# Patient Record
Sex: Male | Born: 1975 | Race: Black or African American | Hispanic: No | Marital: Single | Smoking: Current every day smoker
Health system: Southern US, Community
[De-identification: ages and names within clinical notes are randomized; demographics above are authoritative.]

## PROBLEM LIST (undated history)

## (undated) DIAGNOSIS — B20 Human immunodeficiency virus [HIV] disease: Secondary | ICD-10-CM

## (undated) DIAGNOSIS — F4310 Post-traumatic stress disorder, unspecified: Secondary | ICD-10-CM

## (undated) DIAGNOSIS — Z21 Asymptomatic human immunodeficiency virus [HIV] infection status: Secondary | ICD-10-CM

## (undated) DIAGNOSIS — K611 Rectal abscess: Secondary | ICD-10-CM

## (undated) DIAGNOSIS — B59 Pneumocystosis: Secondary | ICD-10-CM

## (undated) DIAGNOSIS — Z96643 Presence of artificial hip joint, bilateral: Secondary | ICD-10-CM

## (undated) HISTORY — DX: Human immunodeficiency virus (HIV) disease: B20

## (undated) HISTORY — DX: Post-traumatic stress disorder, unspecified: F43.10

## (undated) HISTORY — DX: Presence of artificial hip joint, bilateral: Z96.643

## (undated) HISTORY — DX: Rectal abscess: K61.1

## (undated) HISTORY — PX: HERNIA REPAIR: SHX51

## (undated) HISTORY — PX: JOINT REPLACEMENT: SHX530

## (undated) HISTORY — DX: Pneumocystosis: B59

## (undated) HISTORY — DX: Asymptomatic human immunodeficiency virus (hiv) infection status: Z21

---

## 2003-06-17 DIAGNOSIS — B59 Pneumocystosis: Secondary | ICD-10-CM

## 2003-06-17 HISTORY — DX: Pneumocystosis: B59

## 2014-02-14 DIAGNOSIS — M549 Dorsalgia, unspecified: Secondary | ICD-10-CM | POA: Diagnosis not present

## 2014-02-14 DIAGNOSIS — Z7189 Other specified counseling: Secondary | ICD-10-CM | POA: Diagnosis not present

## 2014-02-14 DIAGNOSIS — M545 Low back pain, unspecified: Secondary | ICD-10-CM | POA: Diagnosis not present

## 2014-02-14 DIAGNOSIS — R5381 Other malaise: Secondary | ICD-10-CM | POA: Diagnosis not present

## 2014-02-14 DIAGNOSIS — H539 Unspecified visual disturbance: Secondary | ICD-10-CM | POA: Diagnosis not present

## 2014-02-14 DIAGNOSIS — I69998 Other sequelae following unspecified cerebrovascular disease: Secondary | ICD-10-CM | POA: Diagnosis not present

## 2014-02-14 DIAGNOSIS — B20 Human immunodeficiency virus [HIV] disease: Secondary | ICD-10-CM | POA: Diagnosis not present

## 2014-02-14 DIAGNOSIS — M25559 Pain in unspecified hip: Secondary | ICD-10-CM | POA: Diagnosis not present

## 2014-02-22 DIAGNOSIS — M899 Disorder of bone, unspecified: Secondary | ICD-10-CM | POA: Diagnosis not present

## 2014-02-22 DIAGNOSIS — M2550 Pain in unspecified joint: Secondary | ICD-10-CM | POA: Diagnosis not present

## 2014-02-22 DIAGNOSIS — M25559 Pain in unspecified hip: Secondary | ICD-10-CM | POA: Diagnosis not present

## 2014-02-23 DIAGNOSIS — R05 Cough: Secondary | ICD-10-CM | POA: Diagnosis not present

## 2014-02-23 DIAGNOSIS — J309 Allergic rhinitis, unspecified: Secondary | ICD-10-CM | POA: Diagnosis not present

## 2014-02-23 DIAGNOSIS — R059 Cough, unspecified: Secondary | ICD-10-CM | POA: Diagnosis not present

## 2014-02-23 DIAGNOSIS — J158 Pneumonia due to other specified bacteria: Secondary | ICD-10-CM | POA: Diagnosis not present

## 2014-02-23 DIAGNOSIS — B20 Human immunodeficiency virus [HIV] disease: Secondary | ICD-10-CM | POA: Diagnosis not present

## 2014-03-07 DIAGNOSIS — M25559 Pain in unspecified hip: Secondary | ICD-10-CM | POA: Diagnosis not present

## 2014-03-07 DIAGNOSIS — R05 Cough: Secondary | ICD-10-CM | POA: Diagnosis not present

## 2014-03-07 DIAGNOSIS — B20 Human immunodeficiency virus [HIV] disease: Secondary | ICD-10-CM | POA: Diagnosis not present

## 2014-03-07 DIAGNOSIS — R059 Cough, unspecified: Secondary | ICD-10-CM | POA: Diagnosis not present

## 2014-03-07 DIAGNOSIS — M549 Dorsalgia, unspecified: Secondary | ICD-10-CM | POA: Diagnosis not present

## 2014-04-06 DIAGNOSIS — J329 Chronic sinusitis, unspecified: Secondary | ICD-10-CM | POA: Diagnosis not present

## 2014-04-06 DIAGNOSIS — Z7189 Other specified counseling: Secondary | ICD-10-CM | POA: Diagnosis not present

## 2014-04-06 DIAGNOSIS — M25559 Pain in unspecified hip: Secondary | ICD-10-CM | POA: Diagnosis not present

## 2014-06-22 DIAGNOSIS — M87859 Other osteonecrosis, unspecified femur: Secondary | ICD-10-CM | POA: Diagnosis not present

## 2014-06-22 DIAGNOSIS — M545 Low back pain: Secondary | ICD-10-CM | POA: Diagnosis not present

## 2014-06-22 DIAGNOSIS — Z23 Encounter for immunization: Secondary | ICD-10-CM | POA: Diagnosis not present

## 2014-06-22 DIAGNOSIS — E559 Vitamin D deficiency, unspecified: Secondary | ICD-10-CM | POA: Diagnosis not present

## 2014-06-22 DIAGNOSIS — B20 Human immunodeficiency virus [HIV] disease: Secondary | ICD-10-CM | POA: Diagnosis not present

## 2014-06-22 DIAGNOSIS — M1990 Unspecified osteoarthritis, unspecified site: Secondary | ICD-10-CM | POA: Diagnosis not present

## 2014-07-06 DIAGNOSIS — B20 Human immunodeficiency virus [HIV] disease: Secondary | ICD-10-CM | POA: Diagnosis not present

## 2014-07-06 DIAGNOSIS — A530 Latent syphilis, unspecified as early or late: Secondary | ICD-10-CM | POA: Diagnosis not present

## 2014-07-06 DIAGNOSIS — M87859 Other osteonecrosis, unspecified femur: Secondary | ICD-10-CM | POA: Diagnosis not present

## 2014-07-06 DIAGNOSIS — E559 Vitamin D deficiency, unspecified: Secondary | ICD-10-CM | POA: Diagnosis not present

## 2014-08-17 DIAGNOSIS — A530 Latent syphilis, unspecified as early or late: Secondary | ICD-10-CM | POA: Diagnosis not present

## 2014-08-17 DIAGNOSIS — M87859 Other osteonecrosis, unspecified femur: Secondary | ICD-10-CM | POA: Diagnosis not present

## 2014-08-17 DIAGNOSIS — B20 Human immunodeficiency virus [HIV] disease: Secondary | ICD-10-CM | POA: Diagnosis not present

## 2014-08-17 DIAGNOSIS — E559 Vitamin D deficiency, unspecified: Secondary | ICD-10-CM | POA: Diagnosis not present

## 2014-10-19 DIAGNOSIS — Z029 Encounter for administrative examinations, unspecified: Secondary | ICD-10-CM | POA: Diagnosis not present

## 2014-10-19 DIAGNOSIS — L989 Disorder of the skin and subcutaneous tissue, unspecified: Secondary | ICD-10-CM | POA: Diagnosis not present

## 2015-01-25 ENCOUNTER — Other Ambulatory Visit (HOSPITAL_COMMUNITY)
Admission: RE | Admit: 2015-01-25 | Discharge: 2015-01-25 | Disposition: A | Discharge status: Home / Self Care | Payer: Medicare Other | Source: Ambulatory Visit | Attending: Internal Medicine | Admitting: Internal Medicine

## 2015-01-25 ENCOUNTER — Other Ambulatory Visit: Payer: Medicare Other

## 2015-01-25 ENCOUNTER — Ambulatory Visit: Payer: Medicare Other

## 2015-01-25 DIAGNOSIS — B20 Human immunodeficiency virus [HIV] disease: Secondary | ICD-10-CM

## 2015-01-25 DIAGNOSIS — Z113 Encounter for screening for infections with a predominantly sexual mode of transmission: Secondary | ICD-10-CM | POA: Insufficient documentation

## 2015-01-25 DIAGNOSIS — Z79899 Other long term (current) drug therapy: Secondary | ICD-10-CM | POA: Diagnosis not present

## 2015-01-25 LAB — COMPLETE METABOLIC PANEL WITH GFR
ALT: 16 U/L (ref 9–46)
AST: 22 U/L (ref 10–40)
Albumin: 4.2 g/dL (ref 3.6–5.1)
Alkaline Phosphatase: 41 U/L (ref 40–115)
BUN: 11 mg/dL (ref 7–25)
CALCIUM: 9.7 mg/dL (ref 8.6–10.3)
CO2: 28 mmol/L (ref 20–31)
Chloride: 102 mmol/L (ref 98–110)
Creat: 0.94 mg/dL (ref 0.60–1.35)
GFR, Est Non African American: >89 mL/min (ref >=60)
GLUCOSE: 77 mg/dL (ref 65–99)
POTASSIUM: 3.8 mmol/L (ref 3.5–5.3)
SODIUM: 139 mmol/L (ref 135–146)
TOTAL PROTEIN: 7.2 g/dL (ref 6.1–8.1)
Total Bilirubin: 0.4 mg/dL (ref 0.2–1.2)

## 2015-01-25 LAB — CBC WITH DIFFERENTIAL/PLATELET
BASOS ABS: 0 10*3/uL (ref 0.0–0.1)
Basophils Relative: 1 % (ref 0–1)
Eosinophils Absolute: 0.4 10*3/uL (ref 0.0–0.7)
Eosinophils Relative: 11 % — ABNORMAL HIGH (ref 0–5)
HCT: 39.7 % (ref 39.0–52.0)
Hemoglobin: 13.9 g/dL (ref 13.0–17.0)
Lymphocytes Relative: 46 % (ref 12–46)
Lymphs Abs: 1.8 10*3/uL (ref 0.7–4.0)
MCH: 31.2 pg (ref 26.0–34.0)
MCHC: 35 g/dL (ref 30.0–36.0)
MCV: 89.2 fL (ref 78.0–100.0)
MPV: 10.3 fL (ref 8.6–12.4)
Monocytes Absolute: 0.6 10*3/uL (ref 0.1–1.0)
Monocytes Relative: 16 % — ABNORMAL HIGH (ref 3–12)
Neutro Abs: 1 10*3/uL — ABNORMAL LOW (ref 1.7–7.7)
Neutrophils Relative %: 26 % — ABNORMAL LOW (ref 43–77)
PLATELETS: 164 10*3/uL (ref 150–400)
RBC: 4.45 MIL/uL (ref 4.22–5.81)
RDW: 14.9 % (ref 11.5–15.5)
WBC: 3.9 10*3/uL — ABNORMAL LOW (ref 4.0–10.5)

## 2015-01-25 LAB — HEPATITIS A ANTIBODY, TOTAL: Hep A Total Ab: REACTIVE — AB

## 2015-01-25 LAB — LIPID PANEL
CHOLESTEROL: 135 mg/dL (ref 125–200)
HDL: 33 mg/dL — ABNORMAL LOW (ref >=40)
LDL Cholesterol: 87 mg/dL (ref <130)
Total CHOL/HDL Ratio: 4.1 Ratio (ref <=5.0)
Triglycerides: 77 mg/dL (ref <150)
VLDL: 15 mg/dL (ref <30)

## 2015-01-25 LAB — HEPATITIS B SURFACE ANTIBODY,QUALITATIVE: Hep B S Ab: POSITIVE — AB

## 2015-01-25 LAB — HEPATITIS B SURFACE ANTIGEN: HEP B S AG: NEGATIVE

## 2015-01-25 LAB — HEPATITIS B CORE ANTIBODY, TOTAL: HEP B C TOTAL AB: NONREACTIVE

## 2015-01-25 LAB — HEPATITIS C ANTIBODY: HCV Ab: NEGATIVE

## 2015-01-25 NOTE — Progress Notes (Signed)
Patient is here today for his first appointment for HIV intake and labwork. Patient transferred from [facility] where he was receiving care. Patient states that he is currently taking Genvoya and has tried not to miss any doses. 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 Overall he has no health complaints, but he was concerned about his housing situation. He has been living in a hotel, but just recently moved into his car. He works at [employer] and will receive his pay on 8/19, at this time he states that he can pay for another week at the hotel. I gave him the number to [facility] for Amber to help, and he needs to contact Baird Lyons his case manager at [facility]. He was ok with this plan. He will return on 02/21/15 to see Dr. Drue Second. I advised that I will call him if his labs come back worrisome.

## 2015-01-26 LAB — URINALYSIS
BILIRUBIN URINE: NEGATIVE
GLUCOSE, UA: NEGATIVE
Hgb urine dipstick: NEGATIVE
KETONES UR: NEGATIVE
Leukocytes, UA: NEGATIVE
Nitrite: NEGATIVE
PROTEIN: NEGATIVE
Specific Gravity, Urine: 1.022 (ref 1.001–1.035)
pH: 7.5 (ref 5.0–8.0)

## 2015-01-26 LAB — RPR TITER: RPR Titer: 1:8 {titer}

## 2015-01-26 LAB — URINE CYTOLOGY ANCILLARY ONLY
CHLAMYDIA, DNA PROBE: NEGATIVE
Neisseria Gonorrhea: NEGATIVE

## 2015-01-26 LAB — T-HELPER CELL (CD4) - (RCID CLINIC ONLY)
CD4 T CELL HELPER: 4 % — AB (ref 33–55)
CD4 T Cell Abs: 80 /uL — ABNORMAL LOW (ref 400–2700)

## 2015-01-26 LAB — FLUORESCENT TREPONEMAL AB(FTA)-IGG-BLD: FLUORESCENT TREPONEMAL ABS: REACTIVE — AB

## 2015-01-26 LAB — RPR: RPR: REACTIVE — AB

## 2015-01-26 NOTE — Addendum Note (Signed)
Addended by: Mariea Clonts D on: 01/26/2015 10:30 AM   Modules accepted: Orders

## 2015-01-28 LAB — QUANTIFERON TB GOLD ASSAY (BLOOD)
Interferon Gamma Release Assay: NEGATIVE
MITOGEN VALUE: 5.56 [IU]/mL
Quantiferon Nil Value: 0.04 IU/mL
Quantiferon Tb Ag Minus Nil Value: 0 IU/mL
TB AG VALUE: 0.04 [IU]/mL

## 2015-01-29 LAB — HIV-1 RNA ULTRAQUANT REFLEX TO GENTYP+
HIV 1 RNA Quant: 20900 copies/mL — ABNORMAL HIGH (ref <20)
HIV-1 RNA Quant, Log: 4.32 {Log} — ABNORMAL HIGH (ref <1.30)

## 2015-02-01 ENCOUNTER — Telehealth: Payer: Self-pay | Admitting: *Deleted

## 2015-02-01 NOTE — Telephone Encounter (Signed)
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 His number is not working at this time and have left a message with his THP counselor if he has contact to give [place] a call asap.

## 2015-02-07 ENCOUNTER — Telehealth: Payer: Self-pay | Admitting: *Deleted

## 2015-02-07 ENCOUNTER — Ambulatory Visit (INDEPENDENT_AMBULATORY_CARE_PROVIDER_SITE_OTHER): Payer: Medicare Other | Admitting: *Deleted

## 2015-02-07 DIAGNOSIS — A539 Syphilis, unspecified: Secondary | ICD-10-CM | POA: Diagnosis present

## 2015-02-07 LAB — HIV-1 GENOTYPR PLUS

## 2015-02-07 MED ORDER — PENICILLIN G BENZATHINE 1200000 UNIT/2ML IM SUSP
1.2000 10*6.[IU] | Freq: Once | INTRAMUSCULAR | Status: AC
  Administered 2015-02-07: 1.2 10*6.[IU] via INTRAMUSCULAR

## 2015-02-07 NOTE — Telephone Encounter (Signed)
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 Patient given 1 dose per Dr Drue Second. He was asked if he had been treated before and he advised yes with 1 dose and has not had sexual contact of any kind since then. Advised him will let the doctor know and that she may need him to be treated with 2 additional doses to complete the treatment. The patient advised he will wait for our call.

## 2015-02-07 NOTE — Telephone Encounter (Signed)
-----   Message from Judyann Munson, MD sent at 01/26/2015  1:38 PM EDT ----- Can we call him to see if he has ever been treated for syphilis? He may need to come to clinic for IM bicillin next week

## 2015-02-12 ENCOUNTER — Telehealth: Payer: Self-pay

## 2015-02-12 ENCOUNTER — Encounter: Payer: Self-pay | Admitting: *Deleted

## 2015-02-12 DIAGNOSIS — B20 Human immunodeficiency virus [HIV] disease: Secondary | ICD-10-CM | POA: Insufficient documentation

## 2015-02-12 NOTE — Telephone Encounter (Signed)
1 dose is fine

## 2015-02-12 NOTE — Telephone Encounter (Signed)
Can we bring him in sooner, this thur if possible. All his labs are in or can we make sure he has started his adap application. He will need bactrim ss daily. Can prescribe genvoya for him for his adap application.

## 2015-02-12 NOTE — Telephone Encounter (Signed)
Patient's next appointment is 02-21-15 with Dr Drue Second.  His CD-4 is 80 with viral load of 20,900. Would you like to start prophylaxis ?   Laurell Josephs, RN

## 2015-02-21 ENCOUNTER — Ambulatory Visit: Payer: Medicare Other | Admitting: Internal Medicine

## 2015-02-23 ENCOUNTER — Encounter: Payer: Self-pay | Admitting: Infectious Diseases

## 2015-02-23 ENCOUNTER — Ambulatory Visit (INDEPENDENT_AMBULATORY_CARE_PROVIDER_SITE_OTHER): Payer: Medicare Other | Admitting: Infectious Diseases

## 2015-02-23 VITALS — BP 126/73 | HR 68 | Temp 97.6°F | Ht 67.0 in | Wt 133.0 lb

## 2015-02-23 DIAGNOSIS — M549 Dorsalgia, unspecified: Secondary | ICD-10-CM | POA: Insufficient documentation

## 2015-02-23 DIAGNOSIS — Z23 Encounter for immunization: Secondary | ICD-10-CM

## 2015-02-23 DIAGNOSIS — M545 Low back pain, unspecified: Secondary | ICD-10-CM

## 2015-02-23 DIAGNOSIS — A539 Syphilis, unspecified: Secondary | ICD-10-CM | POA: Insufficient documentation

## 2015-02-23 DIAGNOSIS — J302 Other seasonal allergic rhinitis: Secondary | ICD-10-CM | POA: Diagnosis not present

## 2015-02-23 DIAGNOSIS — M25531 Pain in right wrist: Secondary | ICD-10-CM | POA: Diagnosis not present

## 2015-02-23 DIAGNOSIS — B20 Human immunodeficiency virus [HIV] disease: Secondary | ICD-10-CM

## 2015-02-23 MED ORDER — FLUTICASONE PROPIONATE 50 MCG/ACT NA SUSP: 1.0000 | Freq: Every day | NASAL | Status: DC

## 2015-02-23 MED ORDER — DRONABINOL 5 MG PO CAPS: 5.0000 mg | ORAL_CAPSULE | Freq: Every day | ORAL | Status: DC

## 2015-02-23 NOTE — Assessment & Plan Note (Signed)
Completes series today, will recheck RPR at f/u

## 2015-02-23 NOTE — Assessment & Plan Note (Signed)
Will check plain films 

## 2015-02-23 NOTE — Progress Notes (Signed)
   Subjective:    Patient ID: Victor Shelton, male    DOB: 1975/10/29, 39 y.o.   MRN: [id number]  HPI 39 yo M with hx of HIV+ since 2005 when he was hospitalized with AIDS, PCP.   He was treated with ATVr/TRV until being changed to genvoya earlier this year. He has done well with this except for abn dreams.  He has been having back pain. Chronic.  He also has been having issues with allergies.  He also fell recently and injured his r 3rd finger.    PMHx reviewed Sochx- reviewed. Pt is on parole. Uses marijuana regularly.  Fhx- father in prison, schizophrenia, mother mental illness. Sister mental illness.   Review of Systems Nl BM, nl urination, no cough, no sob, no change in wt, no fever, no chills, + back pain, + hip pain, + sinus allergies. Right 3rd finger pain and swelling. 12 point ros o/w (-)     Objective:   Physical Exam  Constitutional: He appears well-developed and well-nourished.  HENT:  Mouth/Throat: No oropharyngeal exudate.  Eyes: EOM are normal. Pupils are equal, round, and reactive to light.  Neck: Neck supple.  Cardiovascular: Normal rate, regular rhythm and normal heart sounds.   Pulmonary/Chest: Effort normal and breath sounds normal.  Abdominal: Soft. Bowel sounds are normal. There is no tenderness. There is no rebound.  Musculoskeletal: He exhibits no edema.  Lymphadenopathy:    He has no cervical adenopathy.      Assessment & Plan:

## 2015-02-23 NOTE — Assessment & Plan Note (Addendum)
Advised him to continue zyrtec Use flonase for 1 week only

## 2015-02-23 NOTE — Assessment & Plan Note (Addendum)
Will continue him on genvoya Will give him rx for marinol Offered/refused condoms.  rtc in 6 weeks  HIV 1 RNA QUANT (copies/mL)  Date Value  01/25/2015 20900*   CD4 T CELL ABS (/uL)  Date Value  01/25/2015 80*

## 2015-02-23 NOTE — Assessment & Plan Note (Signed)
Will refer him to PCP He would like to be seen by ortho

## 2015-02-28 ENCOUNTER — Ambulatory Visit: Payer: Medicare Other

## 2015-03-01 ENCOUNTER — Ambulatory Visit: Payer: Medicare Other | Admitting: Family Medicine

## 2015-03-14 ENCOUNTER — Emergency Department (HOSPITAL_COMMUNITY)
Admission: EM | Admit: 2015-03-14 | Discharge: 2015-03-15 | Disposition: A | Discharge status: Home / Self Care | Payer: Medicare Other | Attending: Emergency Medicine | Admitting: Emergency Medicine

## 2015-03-14 ENCOUNTER — Encounter (HOSPITAL_COMMUNITY): Payer: Self-pay

## 2015-03-14 DIAGNOSIS — R112 Nausea with vomiting, unspecified: Secondary | ICD-10-CM | POA: Diagnosis present

## 2015-03-14 DIAGNOSIS — R1912 Hyperactive bowel sounds: Secondary | ICD-10-CM | POA: Diagnosis not present

## 2015-03-14 DIAGNOSIS — B20 Human immunodeficiency virus [HIV] disease: Secondary | ICD-10-CM | POA: Diagnosis not present

## 2015-03-14 DIAGNOSIS — Z720 Tobacco use: Secondary | ICD-10-CM | POA: Diagnosis not present

## 2015-03-14 DIAGNOSIS — Z9889 Other specified postprocedural states: Secondary | ICD-10-CM | POA: Diagnosis not present

## 2015-03-14 DIAGNOSIS — Z8719 Personal history of other diseases of the digestive system: Secondary | ICD-10-CM | POA: Diagnosis not present

## 2015-03-14 DIAGNOSIS — R197 Diarrhea, unspecified: Secondary | ICD-10-CM | POA: Insufficient documentation

## 2015-03-14 LAB — COMPREHENSIVE METABOLIC PANEL
ALBUMIN: 4.4 g/dL (ref 3.5–5.0)
ALT: 17 U/L (ref 17–63)
ANION GAP: 6 (ref 5–15)
AST: 24 U/L (ref 15–41)
Alkaline Phosphatase: 38 U/L (ref 38–126)
BUN: 11 mg/dL (ref 6–20)
CALCIUM: 9.3 mg/dL (ref 8.9–10.3)
CO2: 28 mmol/L (ref 22–32)
CREATININE: 0.92 mg/dL (ref 0.61–1.24)
Chloride: 106 mmol/L (ref 101–111)
GFR calc Af Amer: >60 mL/min (ref >60)
GFR calc non Af Amer: >60 mL/min (ref >60)
GLUCOSE: 105 mg/dL — AB (ref 65–99)
Potassium: 4.1 mmol/L (ref 3.5–5.1)
SODIUM: 140 mmol/L (ref 135–145)
Total Bilirubin: 0.1 mg/dL — ABNORMAL LOW (ref 0.3–1.2)
Total Protein: 7.7 g/dL (ref 6.5–8.1)

## 2015-03-14 LAB — URINALYSIS, ROUTINE W REFLEX MICROSCOPIC
BILIRUBIN URINE: NEGATIVE
GLUCOSE, UA: NEGATIVE mg/dL
Hgb urine dipstick: NEGATIVE
KETONES UR: NEGATIVE mg/dL
LEUKOCYTES UA: NEGATIVE
NITRITE: NEGATIVE
PROTEIN: NEGATIVE mg/dL
Specific Gravity, Urine: 1.017 (ref 1.005–1.030)
Urobilinogen, UA: 1 mg/dL (ref 0.0–1.0)
pH: 6.5 (ref 5.0–8.0)

## 2015-03-14 LAB — CBC
HEMATOCRIT: 39.7 % (ref 39.0–52.0)
HEMOGLOBIN: 13.7 g/dL (ref 13.0–17.0)
MCH: 31.1 pg (ref 26.0–34.0)
MCHC: 34.5 g/dL (ref 30.0–36.0)
MCV: 90.2 fL (ref 78.0–100.0)
PLATELETS: 159 10*3/uL (ref 150–400)
RBC: 4.4 MIL/uL (ref 4.22–5.81)
RDW: 15 % (ref 11.5–15.5)
WBC: 7.9 10*3/uL (ref 4.0–10.5)

## 2015-03-14 LAB — LIPASE, BLOOD: Lipase: 16 U/L — ABNORMAL LOW (ref 22–51)

## 2015-03-14 MED ORDER — DICYCLOMINE HCL 10 MG PO CAPS
10.0000 mg | ORAL_CAPSULE | Freq: Once | ORAL | Status: AC
  Administered 2015-03-15: 10 mg via ORAL
  Filled 2015-03-14: qty 1

## 2015-03-14 MED ORDER — ONDANSETRON 4 MG PO TBDP
4.0000 mg | ORAL_TABLET | Freq: Once | ORAL | Status: AC
  Administered 2015-03-15: 4 mg via ORAL
  Filled 2015-03-14: qty 1

## 2015-03-14 MED ORDER — FAMOTIDINE 20 MG PO TABS
20.0000 mg | ORAL_TABLET | Freq: Once | ORAL | Status: AC
  Administered 2015-03-15: 20 mg via ORAL
  Filled 2015-03-14: qty 1

## 2015-03-14 NOTE — ED Notes (Signed)
PA at bedside.

## 2015-03-14 NOTE — ED Provider Notes (Signed)
CSN: [id number]     Arrival date & time 03/14/15  2115 History   First MD Initiated Contact with Patient 03/14/15 2341     Chief Complaint  Patient presents with  . Abdominal Pain     (Consider location/radiation/quality/duration/timing/severity/associated sxs/prior Treatment) HPI   Blood pressure 121/71, pulse 76, temperature 98.3 F (36.8 C), temperature source Oral, resp. rate 17, SpO2 100 %.  Jaremy Nikash Mortensen is a 39 y.o. male with past medical history significant for HIV, states that he's compliant with his anti-retroviral medications, last CD4 count unknown complaining of 2 episodes of nonbloody, nonbilious, no coffee-ground emesis onset several hours ago with associated loose stool. Patient denies fever, chills, abdominal pain. States that a coworker was sick with nausea vomiting diarrhea approximately one week ago.  Past Medical History  Diagnosis Date  . HIV infection   . PCP (pneumocystis carinii pneumonia) 2005  . H/O bilateral hip replacements   . Perirectal abscess    Past Surgical History  Procedure Laterality Date  . Hernia repair    . Joint replacement     Family History  Problem Relation Age of Onset  . Mental illness Mother   . Hypertension Mother   . Hypertension Father    Social History  Substance Use Topics  . Smoking status: Current Every Day Smoker -- 0.25 packs/day    Types: Cigars, Cigarettes    Start date: 06/17/2003  . Smokeless tobacco: Never Used  . Alcohol Use: No    Review of Systems  10 systems reviewed and found to be negative, except as noted in the HPI.   Allergies  Shellfish allergy and Clindamycin/lincomycin  Home Medications   Prior to Admission medications   Medication Sig Start Date End Date Taking? Authorizing Provider  elvitegravir-cobicistat-emtricitabine-tenofovir (GENVOYA) 150-150-200-10 MG TABS tablet Take 1 tablet by mouth daily with breakfast.   Yes Historical Provider, MD  dronabinol (MARINOL) 5 MG capsule Take  1 capsule (5 mg total) by mouth daily before lunch. 02/23/15   Ginnie Smart, MD  fluticasone (FLONASE) 50 MCG/ACT nasal spray Place 1 spray into both nostrils daily. Patient not taking: Reported on 03/14/2015 02/23/15 03/02/15  Ginnie Smart, MD  ondansetron (ZOFRAN) 4 MG tablet Take 1 tablet (4 mg total) by mouth every 8 (eight) hours as needed for nausea or vomiting. 03/15/15   Joni Reining Camarie Mctigue, PA-C   BP 116/79 mmHg  Pulse 60  Temp(Src) 98.3 F (36.8 C) (Oral)  Resp 16  SpO2 100% Physical Exam  Constitutional: He is oriented to person, place, and time. He appears well-developed and well-nourished. No distress.  HENT:  Head: Normocephalic and atraumatic.  Mouth/Throat: Oropharynx is clear and moist.  Eyes: Conjunctivae and EOM are normal. Pupils are equal, round, and reactive to light.  Neck: Normal range of motion.  Cardiovascular: Normal rate, regular rhythm and intact distal pulses.   Pulmonary/Chest: Effort normal and breath sounds normal. No stridor. No respiratory distress. He has no wheezes. He has no rales. He exhibits no tenderness.  Abdominal: Soft. Bowel sounds are normal. He exhibits no distension and no mass. There is no tenderness. There is no rebound and no guarding.  Hyperactive bowel sounds  Musculoskeletal: Normal range of motion.  Neurological: He is alert and oriented to person, place, and time.  Skin: He is not diaphoretic.  Psychiatric: He has a normal mood and affect.  Nursing note and vitals reviewed.   ED Course  Procedures (including critical care time) Labs Review Labs Reviewed  LIPASE, BLOOD - Abnormal; Notable for the following:    Lipase 16 (*)    All other components within normal limits  COMPREHENSIVE METABOLIC PANEL - Abnormal; Notable for the following:    Glucose, Bld 105 (*)    Total Bilirubin 0.1 (*)    All other components within normal limits  CBC  URINALYSIS, ROUTINE W REFLEX MICROSCOPIC (NOT AT [facility])    Imaging Review No  results found. I have personally reviewed and evaluated these images and lab results as part of my medical decision-making.   EKG Interpretation None      MDM   Final diagnoses:  Nausea vomiting and diarrhea    Filed Vitals:   03/14/15 2123 03/15/15 0011  BP: 121/71 116/79  Pulse: 76 60  Temp: 98.3 F (36.8 C)   TempSrc: Oral   Resp: 17 16  SpO2: 100% 100%    Medications  ondansetron (ZOFRAN-ODT) disintegrating tablet 4 mg (4 mg Oral Given 03/15/15 0011)  dicyclomine (BENTYL) capsule 10 mg (10 mg Oral Given 03/15/15 0012)  famotidine (PEPCID) tablet 20 mg (20 mg Oral Given 03/15/15 0012)    Cain Sieve Luman Holway is a pleasant 39 y.o. male presenting with nausea vomiting diarrhea onset today. Patient is afebrile and well-appearing. Blood work without significant abnormality. Abdominal exam with no tenderness to deep palpation of any quadrant. Likely viral, will give ODT, Bentyl, Pepcid and by mouth challenge.  Evaluation does not show pathology that would require ongoing emergent intervention or inpatient treatment. Pt is hemodynamically stable and mentating appropriately. Discussed findings and plan with patient/guardian, who agrees with care plan. All questions answered. Return precautions discussed and outpatient follow up given.   Discharge Medication List as of 03/15/2015 12:26 AM    START taking these medications   Details  ondansetron (ZOFRAN) 4 MG tablet Take 1 tablet (4 mg total) by mouth every 8 (eight) hours as needed for nausea or vomiting., Starting 03/15/2015, Until Discontinued, [employer], PA-C 03/15/15 0107  Loren Racer, MD 03/15/15 [phone]

## 2015-03-14 NOTE — ED Notes (Signed)
Pt states he vomited twice at work tonight, he thinks he ate some rotten food a few days ago

## 2015-03-15 DIAGNOSIS — R112 Nausea with vomiting, unspecified: Secondary | ICD-10-CM | POA: Diagnosis not present

## 2015-03-15 MED ORDER — ONDANSETRON HCL 4 MG PO TABS: 4.0000 mg | ORAL_TABLET | Freq: Three times a day (TID) | ORAL | Status: DC | PRN

## 2015-03-15 NOTE — Discharge Instructions (Signed)
Do not hesitate to return to the emergency room for any new, worsening or concerning symptoms. ° °Please obtain primary care using resource guide below. Let them know that you were seen in the emergency room and that they will need to obtain records for further outpatient management. ° ° ° °Emergency Department Resource Guide °1) Find a Doctor and Pay Out of Pocket °Although you won't have to find out who is covered by your insurance plan, it is a good idea to ask around and get recommendations. You will then need to call the office and see if the doctor you have chosen will accept you as a new patient and what types of options they offer for patients who are self-pay. Some doctors offer discounts or will set up payment plans for their patients who do not have insurance, but you will need to ask so you aren't surprised when you get to your appointment. ° °2) Contact Your Local Health Department °Not all health departments have doctors that can see patients for sick visits, but many do, so it is worth a call to see if yours does. If you don't know where your local health department is, you can check in your phone book. The CDC also has a tool to help you locate your state's health department, and many state websites also have listings of all of their local health departments. ° °3) Find a Walk-in Clinic °If your illness is not likely to be very severe or complicated, you may want to try a walk in clinic. These are popping up all over the country in pharmacies, drugstores, and shopping centers. They're usually staffed by nurse practitioners or physician assistants that have been trained to treat common illnesses and complaints. They're usually fairly quick and inexpensive. However, if you have serious medical issues or chronic medical problems, these are probably not your best option. ° °No Primary Care Doctor: °- Call [facility] Connect at  832-8000 - they can help you locate a primary care doctor that  accepts your  insurance, provides certain services, etc. °- Physician Referral Service- 1-800-533-3463 ° °Chronic Pain Problems: °Organization         Address  Phone   Notes  °[facility]  (336) 297-2271 Patients need to be referred by their primary care doctor.  ° °Medication Assistance: °Organization         Address  Phone   Notes  °[place] [facility] [address] °[place], [place] [address] (336) 641-8030 --Must be a resident of [place] °-- Must have NO insurance coverage whatsoever (no Medicaid/ Medicare, etc.) °-- The pt. MUST have a primary care doctor that directs their care regularly and follows them in the community °  °[facility]  (866) 331-1348   °[employer] Way  (888) 892-1162   ° °Agencies that provide inexpensive medical care: °Organization         Address  Phone   Notes  °[facility]  (336) 832-8035   °[facility]    (336) 832-7272   °[facility] [address] °[place], [place] [address] (336) 832-4777   °[facility] [address], °[place] (336) 271-4999   °[facility]    (336) 373-0678   °[facility]    (336) 272-1050   °[facility] ° [address], [place] Phone:  (336) 832-4444, Fax:  (336) 832-4440 Hours of Operation:  9 am -   6 pm, M-F.  Also accepts Medicaid/Medicare and self-pay.  °[facility] ° [address], [place] Phone: (336) 832-3150, Fax: (336) 832-3151. Hours of Operation:  8:30 am - 5:30 pm, M-F.  Also accepts Medicaid and self-pay.  °[facility] [place] [address], [place] Phone: (336) 878-6027   °[facility] [address], [place], [place] (336)723-1848, Ext. 123 Mondays & Thursdays: 7-9 AM.  First 15 patients are seen on a first come, first serve basis. °  ° °Medicaid-accepting [place] Providers: ° °Organization          Address  Phone   Notes  °[facility] [address], [place] (336) 641-2100 Also accepts self-pay patients.  °[facility] [address], [place] ° (336) 856-9996   °[facility] [address], [place] (336) 288-8857   °[facility] [address] [place] [address], [place] (336) 299-7000   °Veita Bland [address], [place]  ° (336) 373-1557 Only accepts [place] Access Medicaid patients after they have their name applied to their card.  ° °Self-Pay (no insurance) in [place]: ° °Organization         Address  Phone   Notes  °Sickle Cell Patients, [facility] [address], [place] (336) 832-1970   °[facility] [address], [place] (336) 832-4400   °[facility] ° [address], [place] (336) 992-4800   °[facility]/Dr. Osei-Bonsu ° [address] [place] [address], [place] or [address], [place] (336) 841-8500 Phone number for both [place] and [place] locations is the same.  °[facility] [address], [place] (336) 299-0000   °[facility] [address] [place] [address], [place] or [address] (336) 852-7530 °(336) 878-2260   °[facility] [address], [place] (336) 350-1642, phone; (336) 294-5005, fax Sees patients 1st and 3rd Saturday of every month.  Must not qualify for public or private insurance (i.e. Medicaid, Medicare, [place] Health Choice, Veterans' Benefits) • Household income should be no more than 200% of the poverty level •The clinic cannot treat you if you are pregnant or think you are pregnant • Sexually transmitted diseases are not treated at the clinic.  ° ° °Dental Care: °Organization         Address  Phone  Notes  °[place] [facility] [address],  [place] (336) 641-6152 Accepts children up to age 21 who are enrolled in Medicaid or [place] Health Choice; pregnant women with a Medicaid card; and children who have applied for Medicaid or [place] Health Choice, but were declined, whose parents can pay a reduced fee at time of service.  °[facility] [place]  [address], [place] (336) 641-7733 Accepts children up to age 21 who are enrolled in Medicaid or [place] Health Choice; pregnant women with a Medicaid card; and children who have applied for Medicaid or [place] Health Choice, but were declined, whose parents can pay a reduced fee at time of service.  °[facility] ° [address], [place] (336) 641-4533 Patients are seen by appointment only. Walk-ins are not accepted. [facility] will see patients 18 years of age and   older. °Monday - Tuesday (8am-5pm) °Most Wednesdays (8:30-5pm) °$30 per visit, cash only  °[facility] ° [address], [place] (336) 641-4533 Patients are seen by appointment only. Walk-ins are not accepted. [facility] will see patients 18 years of age and older. °One Wednesday Evening (Monthly: Volunteer Based).  $30 per visit, cash only  °[facility]  (919) 537-3737 for adults; Children under age 4, call Graduate Pediatric Dentistry at (919) [phone]. Children aged 4-14, please call (919) 537-3737 to request a pediatric application. ° Dental services are provided in all areas of dental care including fillings, crowns and bridges, complete and partial dentures, implants, gum treatment, root canals, and extractions. Preventive care is also provided. Treatment is provided to both adults and children. °Patients are selected via a lottery and there is often a waiting list. °  °[facility] [address], °[place] ° (336) 763-8833 [email or web address] °  °[facility] [address], [place], [place]  (336)723-1848, Ext. 123 Second and Fourth Thursday of each month, opens at 6:30 AM; Clinic ends at 9 AM.  Patients are seen on a first-come first-served basis, and a limited number are seen during each clinic.  ° °[facility] ° [address], [place], [place] (336) 723-7904   Eligibility Requirements °You must have lived in [place], [place], or [place] counties for at least the last three months. °  You cannot be eligible for state or federal sponsored healthcare insurance, including Veterans Administration, Medicaid, or Medicare. °  You generally cannot be eligible for healthcare insurance through your employer.  °  How to apply: °Eligibility screenings are held every Tuesday and Wednesday afternoon from 1:00 pm until 4:00 pm. You do not need an appointment for the interview!  °[facility] [address], [place], [place] 336-631-2330   °[place] [facility]  336-342-8273   °[place]  336-703-3100   ° [facility]  336-570-6415   ° °Behavioral Health Resources in the Community: °Intensive Outpatient Programs °Organization         Address  Phone  Notes  °[facility] [address], [place], [place] 336-878-6098   °[facility] [address], [place], [place] 336-832-9800   °[facility] [address], [place], [place] ° 336-882-2125   °[facility] [address],  °[place], [place] 1-800-853-5163 or 336-641-4981   °Substance Abuse Resources °Organization         Address  Phone  Notes  °[facility]  336-882-2125   °[facility]  336-784-9470   °[facility]  336-285-9073   °[facility]  336-845-3988   °Residential & Outpatient Substance Abuse Program  1-800-659-3381   °Psychological Services °Organization         Address  Phone  Notes  °[facility]  336- 832-9600   °[facility]  336- 378-7881    °[facility] [address], [place] 1-800-853-5163 or 336-641-4981   ° °Mobile Crisis Teams °Organization         Address  Phone  Notes  °[facility], Mobile Crisis Care Unit  1-877-626-1772   °Assertive °[facility] ° [address] [place], [place] 336-834-9664   °Sharon DeEsch [address] °[place] 336-554-5454   ° °Self-Help/Support Groups °Organization         Address    Phone             Notes  °[facility] [place] - variety of support groups  336- 373-1402 Call for more information  °Narcotics Anonymous (NA), [facility] [address], °[place]  2 meetings at this location  ° °Residential Treatment Programs °Organization         Address  Phone  Notes  °[facility] [address],    °[place]  1-866-801-8205   °[facility] ° [address], [place], [place] 704-293-8524   °[facility] [address], [place] 336-845-3988 Admissions: 8am-3pm M-F  °[facility] [address]    °[place], [place] 336-841-1104   °[facility] [address], [place], [place] 336-379-7146   °[facility] [address]  °[place], [place] 336-285-9073   °[facility] Intensive Outpatient [address], [place], [place] 336-852-3033   °[facility] [address]  °[place], [place] 1-877-615-2722 or 336-784-9470   °[facility] ([facility] [address] [place], [place] 336-227-7417 Accepts Medicaid  °[facility] [address]  °[place] 1-800-659-3381 Substance Abuse/Addiction Treatment  ° °[place] Behavioral Health Resources °Organization         Address  Phone  Notes  °[facility]  (888) 581-9988   °Julie Brannon, PhD [address] [place], [place]   (336) 349-5553 or (336) 951-0000   °[facility]   [address] °[place], [place] (336) 349-4454   °[facility] [address], [place], [place] (336) 342-8316 Insurance/Medicaid/sponsorship through [facility]  °[facility] [address]                                    [place], [place] (336) 342-8316 Therapy/tele-psych/case  °[facility] [address]  ° [place], [place] (336) 349-2233    °Dr. Arfeen  (336) 349-4544   °[facility] [place]  [employer] [place] [facility] 1) [address], [place] °2) [address], [place] °3)  [address], [place] (336) 349-3220 °(336) 342-7768 ° °(336) 342-8140   °[place] [facility] (336) 342-1394 or (336) 342-3537 (After Hours)    ° ° ° °

## 2015-03-15 NOTE — ED Notes (Signed)
Patient given water for PO challenge. Patient encouraged to sip slowly. Will re-evaluate.

## 2015-03-26 ENCOUNTER — Other Ambulatory Visit: Payer: Medicare Other

## 2015-03-29 ENCOUNTER — Ambulatory Visit: Payer: Medicare Other | Admitting: Family Medicine

## 2015-03-30 ENCOUNTER — Ambulatory Visit: Payer: Medicare Other | Admitting: Family Medicine

## 2015-04-09 ENCOUNTER — Encounter: Payer: Self-pay | Admitting: Infectious Diseases

## 2015-04-09 ENCOUNTER — Ambulatory Visit (INDEPENDENT_AMBULATORY_CARE_PROVIDER_SITE_OTHER): Payer: Medicare Other | Admitting: Infectious Diseases

## 2015-04-09 ENCOUNTER — Other Ambulatory Visit (HOSPITAL_COMMUNITY)
Admission: RE | Admit: 2015-04-09 | Discharge: 2015-04-09 | Disposition: A | Discharge status: Home / Self Care | Payer: Medicare Other | Source: Ambulatory Visit | Attending: Infectious Diseases | Admitting: Infectious Diseases

## 2015-04-09 VITALS — BP 132/83 | HR 61 | Temp 97.6°F | Ht 66.0 in | Wt 136.0 lb

## 2015-04-09 DIAGNOSIS — B20 Human immunodeficiency virus [HIV] disease: Secondary | ICD-10-CM | POA: Diagnosis not present

## 2015-04-09 DIAGNOSIS — Z79899 Other long term (current) drug therapy: Secondary | ICD-10-CM | POA: Diagnosis not present

## 2015-04-09 DIAGNOSIS — J302 Other seasonal allergic rhinitis: Secondary | ICD-10-CM

## 2015-04-09 DIAGNOSIS — A539 Syphilis, unspecified: Secondary | ICD-10-CM | POA: Diagnosis not present

## 2015-04-09 DIAGNOSIS — Z113 Encounter for screening for infections with a predominantly sexual mode of transmission: Secondary | ICD-10-CM | POA: Insufficient documentation

## 2015-04-09 DIAGNOSIS — L309 Dermatitis, unspecified: Secondary | ICD-10-CM

## 2015-04-09 LAB — CBC
HCT: 40.1 % (ref 39.0–52.0)
HEMOGLOBIN: 13.4 g/dL (ref 13.0–17.0)
MCH: 31.2 pg (ref 26.0–34.0)
MCHC: 33.4 g/dL (ref 30.0–36.0)
MCV: 93.3 fL (ref 78.0–100.0)
MPV: 11.3 fL (ref 8.6–12.4)
PLATELETS: 161 10*3/uL (ref 150–400)
RBC: 4.3 MIL/uL (ref 4.22–5.81)
RDW: 15.4 % (ref 11.5–15.5)
WBC: 5.2 10*3/uL (ref 4.0–10.5)

## 2015-04-09 LAB — COMPREHENSIVE METABOLIC PANEL
ALBUMIN: 3.8 g/dL (ref 3.6–5.1)
ALT: 14 U/L (ref 9–46)
AST: 21 U/L (ref 10–40)
Alkaline Phosphatase: 34 U/L — ABNORMAL LOW (ref 40–115)
BILIRUBIN TOTAL: 0.3 mg/dL (ref 0.2–1.2)
BUN: 9 mg/dL (ref 7–25)
CHLORIDE: 106 mmol/L (ref 98–110)
CO2: 27 mmol/L (ref 20–31)
CREATININE: 0.83 mg/dL (ref 0.60–1.35)
Calcium: 8.9 mg/dL (ref 8.6–10.3)
Glucose, Bld: 97 mg/dL (ref 65–99)
Potassium: 3.9 mmol/L (ref 3.5–5.3)
SODIUM: 139 mmol/L (ref 135–146)
TOTAL PROTEIN: 6.6 g/dL (ref 6.1–8.1)

## 2015-04-09 MED ORDER — ATAZANAVIR-COBICISTAT 300-150 MG PO TABS: 1.0000 | ORAL_TABLET | Freq: Every day | ORAL | Status: DC

## 2015-04-09 MED ORDER — TERBINAFINE HCL 250 MG PO TABS: 250.0000 mg | ORAL_TABLET | Freq: Every day | ORAL | Status: DC

## 2015-04-09 MED ORDER — EMTRICITABINE-TENOFOVIR AF 200-25 MG PO TABS: 1.0000 | ORAL_TABLET | Freq: Every day | ORAL | Status: DC

## 2015-04-09 NOTE — Assessment & Plan Note (Signed)
Using flonase very sparingly.

## 2015-04-09 NOTE — Assessment & Plan Note (Addendum)
Will change his genvoya to ATVc/descovy Hopefully will improve his gi upset. Will see him back in 8 weeks.  Given flu shot today.

## 2015-04-09 NOTE — Assessment & Plan Note (Signed)
Suspect that this is fungal. Will give him lamisil.

## 2015-04-09 NOTE — Progress Notes (Signed)
   Subjective:    Patient ID: Victor DuvalKuta Kenta Sartin, male    DOB: September 29, 1975, 39 y.o.   MRN: [id number]  HPI 39 yo M with hx of HIV+ since 2005 when he was hospitalized with AIDS, PCP.  He was treated with ATVr/TRV until being changed to genvoya early 2016.  HIV 1 RNA QUANT (copies/mL)  Date Value  01/25/2015 20900*   CD4 T CELL ABS (/uL)  Date Value  01/25/2015 80*   Has been having GI upset since being seen at ED mid-sept. Still having BM 6-7x/day. Also very lactose intolerant. Also not clear if his genvoya is causing this.  Also with conplaints about pruritis of feet, has sores on them.  Same partner since treated for syphillis. His partner is HIV+, not on tx. They have not had sex yet.   Review of Systems  Constitutional: Negative for appetite change and unexpected weight change.  Gastrointestinal: Positive for diarrhea. Negative for constipation.  Genitourinary: Negative for difficulty urinating.  Skin: Positive for rash.  Please see HPI. 12 point ROS o/w (-)      Objective:   Physical Exam  Constitutional: He appears well-developed and well-nourished.  HENT:  Mouth/Throat: No oropharyngeal exudate.  Eyes: EOM are normal. Pupils are equal, round, and reactive to light.  Neck: Neck supple.  Cardiovascular: Normal rate, regular rhythm and normal heart sounds.   Pulmonary/Chest: Effort normal and breath sounds normal.  Abdominal: Soft. Bowel sounds are normal. There is no tenderness. There is no rebound.  Musculoskeletal:       Feet:  Lymphadenopathy:    He has no cervical adenopathy.      Assessment & Plan:

## 2015-04-09 NOTE — Assessment & Plan Note (Signed)
Will recheck his RPR 

## 2015-04-10 LAB — RPR TITER: RPR Titer: 1:8 {titer}

## 2015-04-10 LAB — RPR: RPR: REACTIVE — AB

## 2015-04-10 LAB — URINE CYTOLOGY ANCILLARY ONLY
Chlamydia: NEGATIVE
Neisseria Gonorrhea: NEGATIVE

## 2015-04-10 LAB — T-HELPER CELL (CD4) - (RCID CLINIC ONLY)
CD4 T CELL ABS: 100 /uL — AB (ref 400–2700)
CD4 T CELL HELPER: 6 % — AB (ref 33–55)

## 2015-04-10 LAB — FLUORESCENT TREPONEMAL AB(FTA)-IGG-BLD: Fluorescent Treponemal ABS: REACTIVE — AB

## 2015-04-11 LAB — HIV-1 RNA QUANT-NO REFLEX-BLD
HIV 1 RNA Quant: 4474 copies/mL — ABNORMAL HIGH (ref <20)
HIV-1 RNA QUANT, LOG: 3.65 {Log_copies}/mL — AB (ref <1.30)

## 2015-04-28 ENCOUNTER — Emergency Department (HOSPITAL_COMMUNITY)
Admission: EM | Admit: 2015-04-28 | Discharge: 2015-04-28 | Disposition: A | Discharge status: Home / Self Care | Payer: Medicare Other | Attending: Emergency Medicine | Admitting: Emergency Medicine

## 2015-04-28 ENCOUNTER — Encounter (HOSPITAL_COMMUNITY): Payer: Self-pay | Admitting: Emergency Medicine

## 2015-04-28 ENCOUNTER — Emergency Department (HOSPITAL_COMMUNITY): Payer: Medicare Other

## 2015-04-28 DIAGNOSIS — R1084 Generalized abdominal pain: Secondary | ICD-10-CM | POA: Diagnosis not present

## 2015-04-28 DIAGNOSIS — Z872 Personal history of diseases of the skin and subcutaneous tissue: Secondary | ICD-10-CM | POA: Insufficient documentation

## 2015-04-28 DIAGNOSIS — R109 Unspecified abdominal pain: Secondary | ICD-10-CM | POA: Diagnosis not present

## 2015-04-28 DIAGNOSIS — Z21 Asymptomatic human immunodeficiency virus [HIV] infection status: Secondary | ICD-10-CM | POA: Insufficient documentation

## 2015-04-28 DIAGNOSIS — R197 Diarrhea, unspecified: Secondary | ICD-10-CM | POA: Insufficient documentation

## 2015-04-28 DIAGNOSIS — Z96643 Presence of artificial hip joint, bilateral: Secondary | ICD-10-CM | POA: Diagnosis not present

## 2015-04-28 DIAGNOSIS — Z8701 Personal history of pneumonia (recurrent): Secondary | ICD-10-CM | POA: Insufficient documentation

## 2015-04-28 DIAGNOSIS — Z720 Tobacco use: Secondary | ICD-10-CM | POA: Diagnosis not present

## 2015-04-28 DIAGNOSIS — Z79899 Other long term (current) drug therapy: Secondary | ICD-10-CM | POA: Insufficient documentation

## 2015-04-28 LAB — COMPREHENSIVE METABOLIC PANEL WITH GFR
ALT: 19 U/L (ref 17–63)
AST: 26 U/L (ref 15–41)
Albumin: 4.4 g/dL (ref 3.5–5.0)
Alkaline Phosphatase: 46 U/L (ref 38–126)
Anion gap: 6 (ref 5–15)
BUN: 13 mg/dL (ref 6–20)
CO2: 28 mmol/L (ref 22–32)
Calcium: 9.3 mg/dL (ref 8.9–10.3)
Chloride: 105 mmol/L (ref 101–111)
Creatinine, Ser: 1.02 mg/dL (ref 0.61–1.24)
GFR calc Af Amer: >60 mL/min
GFR calc non Af Amer: >60 mL/min
Glucose, Bld: 96 mg/dL (ref 65–99)
Potassium: 3.9 mmol/L (ref 3.5–5.1)
Sodium: 139 mmol/L (ref 135–145)
Total Bilirubin: 0.7 mg/dL (ref 0.3–1.2)
Total Protein: 7.8 g/dL (ref 6.5–8.1)

## 2015-04-28 LAB — CBC WITH DIFFERENTIAL/PLATELET
Basophils Absolute: 0 K/uL (ref 0.0–0.1)
Basophils Relative: 0 %
Eosinophils Absolute: 1 K/uL — ABNORMAL HIGH (ref 0.0–0.7)
Eosinophils Relative: 20 %
HCT: 43.5 % (ref 39.0–52.0)
Hemoglobin: 14.7 g/dL (ref 13.0–17.0)
Lymphocytes Relative: 38 %
Lymphs Abs: 1.9 K/uL (ref 0.7–4.0)
MCH: 30.9 pg (ref 26.0–34.0)
MCHC: 33.8 g/dL (ref 30.0–36.0)
MCV: 91.4 fL (ref 78.0–100.0)
Monocytes Absolute: 0.8 K/uL (ref 0.1–1.0)
Monocytes Relative: 15 %
Neutro Abs: 1.3 K/uL — ABNORMAL LOW (ref 1.7–7.7)
Neutrophils Relative %: 27 %
Platelets: 209 K/uL (ref 150–400)
RBC: 4.76 MIL/uL (ref 4.22–5.81)
RDW: 14.3 % (ref 11.5–15.5)
WBC: 4.9 K/uL (ref 4.0–10.5)

## 2015-04-28 LAB — LIPASE, BLOOD: Lipase: 20 U/L (ref 11–51)

## 2015-04-28 LAB — URINALYSIS, ROUTINE W REFLEX MICROSCOPIC
Bilirubin Urine: NEGATIVE
Glucose, UA: NEGATIVE mg/dL
Hgb urine dipstick: NEGATIVE
Ketones, ur: NEGATIVE mg/dL
Leukocytes, UA: NEGATIVE
Nitrite: NEGATIVE
Protein, ur: NEGATIVE mg/dL
Specific Gravity, Urine: 1.028 (ref 1.005–1.030)
Urobilinogen, UA: 1 mg/dL (ref 0.0–1.0)
pH: 6 (ref 5.0–8.0)

## 2015-04-28 NOTE — ED Notes (Signed)
Patient here for abd pain. States that he drinks 2L of ginger ale per day. Hx HIV. Blood in stool. Nausea, denies vomiting. Reports drinking a bottle pepto bismol once a week.

## 2015-04-28 NOTE — Discharge Instructions (Signed)
Abdominal Pain, Adult Many things can cause abdominal pain. Usually, abdominal pain is not caused by a disease and will improve without treatment. It can often be observed and treated at home. Your health care provider will do a physical exam and possibly order blood tests and X-rays to help determine the seriousness of your pain. However, in many cases, more time must pass before a clear cause of the pain can be found. Before that point, your health care provider may not know if you need more testing or further treatment. HOME CARE INSTRUCTIONS Monitor your abdominal pain for any changes. The following actions may help to alleviate any discomfort you are experiencing:  Only take over-the-counter or prescription medicines as directed by your health care provider.  Do not take laxatives unless directed to do so by your health care provider.  Try a clear liquid diet (broth, tea, or water) as directed by your health care provider. Slowly move to a bland diet as tolerated. SEEK MEDICAL CARE IF:  You have unexplained abdominal pain.  You have abdominal pain associated with nausea or diarrhea.  You have pain when you urinate or have a bowel movement.  You experience abdominal pain that wakes you in the night.  You have abdominal pain that is worsened or improved by eating food.  You have abdominal pain that is worsened with eating fatty foods.  You have a fever. SEEK IMMEDIATE MEDICAL CARE IF:  Your pain does not go away within 2 hours.  You keep throwing up (vomiting).  Your pain is felt only in portions of the abdomen, such as the right side or the left lower portion of the abdomen.  You pass bloody or black tarry stools. MAKE SURE YOU:  Understand these instructions.  Will watch your condition.  Will get help right away if you are not doing well or get worse.   This information is not intended to replace advice given to you by your health care provider. Make sure you discuss  any questions you have with your health care provider.  Follow up with your PCP as needed or if symptoms worsen. Keep appointment with infectious disease physician. Begin taking your antivirals. Encourage high fiber diet, fiber supplementation with Metamucil. Take home Bentyl and Pepcid. Return to the Emergency Department if you experience blood in you stool or vomit, fever, increased pain, burning with urination.

## 2015-04-28 NOTE — ED Provider Notes (Signed)
CSN: [id number]     Arrival date & time 04/28/15  1412 History   First MD Initiated Contact with Patient 04/28/15 1558     Chief Complaint  Patient presents with  . Abdominal Pain     (Consider location/radiation/quality/duration/timing/severity/associated sxs/prior Treatment) HPI  Victor Shelton is a 39 y.o M with a past medical history of HIV, who presents the emergency department complaining of abdominal pain and diarrhea. Patient states that he was seen in the emergency department in September for the same issue and was discharged on Bentyl and Pepcid. He had recently been placed on a new HIV medication at that time and soon after developed intermittent mid abdominal pain and diarrhea. Patient describes the pain as like a band across his mid abdomen The pain is crampy and waxes and wanes. Patient gets minimal relief from Pepto-Bismol and ginger ale. Patient is able to tolerate PO well. Patient is also having associated symptoms of diarrhea. At times patient feels like he is constipated and then at times he is having loose stools 6-7 times per day. Patient saw his infectious disease physician on  10/24 who  Changed his antiviral medication as he thought that this was the cause of his GI upset.Patient states that he has not started taking his new medication yet. Denies fever, vomiting,melena, hematochezia, hematemesis, dizziness, chest pain, shortness of breath, weakness.  Past Medical History  Diagnosis Date  . HIV infection (HCC)   . PCP (pneumocystis carinii pneumonia) (HCC) 2005  . H/O bilateral hip replacements   . Perirectal abscess    Past Surgical History  Procedure Laterality Date  . Hernia repair    . Joint replacement     Family History  Problem Relation Age of Onset  . Mental illness Mother   . Hypertension Mother   . Hypertension Father    Social History  Substance Use Topics  . Smoking status: Current Every Day Smoker -- 0.25 packs/day    Types: Cigars,  Cigarettes    Start date: 06/17/2003  . Smokeless tobacco: Never Used  . Alcohol Use: No    Review of Systems  All other systems reviewed and are negative.     Allergies  Lactose intolerance (gi); Shellfish allergy; and Clindamycin/lincomycin  Home Medications   Prior to Admission medications   Medication Sig Start Date End Date Taking? Authorizing Provider  atazanavir-cobicistat (EVOTAZ) 300-150 MG tablet Take 1 tablet by mouth daily. Swallow whole. Do NOT crush, cut or chew tablet. Take with food. 04/09/15  Yes Ginnie SmartJeffrey C Hatcher, MD  dronabinol (MARINOL) 5 MG capsule Take 1 capsule (5 mg total) by mouth daily before lunch. 02/23/15  Yes Ginnie SmartJeffrey C Hatcher, MD  emtricitabine-tenofovir AF (DESCOVY) 200-25 MG tablet Take 1 tablet by mouth daily. 04/09/15  Yes Ginnie SmartJeffrey C Hatcher, MD  fluticasone (FLONASE) 50 MCG/ACT nasal spray Place 1 spray into both nostrils daily. Patient taking differently: Place 1 spray into both nostrils daily as needed for allergies.  02/23/15 04/28/15 Yes Ginnie SmartJeffrey C Hatcher, MD  ondansetron (ZOFRAN) 4 MG tablet Take 1 tablet (4 mg total) by mouth every 8 (eight) hours as needed for nausea or vomiting. Patient not taking: Reported on 04/09/2015 03/15/15   Joni ReiningNicole Pisciotta, PA-C  terbinafine (LAMISIL) 250 MG tablet Take 1 tablet (250 mg total) by mouth daily. 04/09/15   Ginnie SmartJeffrey C Hatcher, MD   BP 128/81 mmHg  Pulse 66  Temp(Src) 98.6 F (37 C) (Oral)  Resp 16  SpO2 99% Physical Exam  Constitutional: He  is oriented to person, place, and time. He appears well-developed and well-nourished. No distress.  HENT:  Head: Normocephalic and atraumatic.  Mouth/Throat: No oropharyngeal exudate.  Eyes: Conjunctivae and EOM are normal. Pupils are equal, round, and reactive to light. Right eye exhibits no discharge. Left eye exhibits no discharge. No scleral icterus.  Cardiovascular: Normal rate, regular rhythm, normal heart sounds and intact distal pulses.  Exam reveals no  gallop and no friction rub.   No murmur heard. Pulmonary/Chest: Effort normal and breath sounds normal. No respiratory distress. He has no wheezes. He has no rales. He exhibits no tenderness.  Abdominal: Soft. Bowel sounds are normal. He exhibits no distension. There is no tenderness. There is no guarding.  Musculoskeletal: Normal range of motion. He exhibits no edema.  Neurological: He is alert and oriented to person, place, and time. No cranial nerve deficit.  Strength 5/5 throughout. No sensory deficits.  No gait abnormality  Skin: Skin is warm and dry. No rash noted. He is not diaphoretic. No erythema. No pallor.  Psychiatric: He has a normal mood and affect. His behavior is normal.  Nursing note and vitals reviewed.   ED Course  Procedures (including critical care time) Labs Review Labs Reviewed  CBC WITH DIFFERENTIAL/PLATELET - Abnormal; Notable for the following:    Neutro Abs 1.3 (*)    Eosinophils Absolute 1.0 (*)    All other components within normal limits  COMPREHENSIVE METABOLIC PANEL  LIPASE, BLOOD  URINALYSIS, ROUTINE W REFLEX MICROSCOPIC (NOT AT [facility])    Imaging Review No results found. I have personally reviewed and evaluated these images and lab results as part of my medical decision-making.   EKG Interpretation None      MDM   Final diagnoses:  Generalized abdominal pain    39 y.o M with HIV presents with 1 month history of intermittent abdominal pain with waxing and waning diarrhea vs constipation. Denies melena/hematochezia. Pt has been taking home bentyl and pepcid with no relief. Pt saw ID physician 10/24 who d/c pt antiviral as he suspected this was causing GI upset and switched pt to a new antiviral which pt has not started taking.   Labs wnl. NO peritoneal signs on exam. NO concern for appendicitis, ruptured peptic ulcer, diverticulitis or cholecystitis.  Abd xray negative. NO increased stool burden.  Symptoms consistent with IBS. Abdominal pain  relieved with defacation. Recommend increased fiber supplementation. Continue taking home bentyl and pepcid. Recommend that pt begin taking antivirals as prescribed by ID physician. He is to follow up with ID within the month. Pt able to tolerate PO. Appears well in ED, in NAD. NO clinical or laboratory finding suggestive of dehydration. Stable for discharge.     Lester Kinsman Brent, PA-C 04/28/15 1832  Pricilla Loveless, MD 05/01/15 [phone]

## 2015-05-01 ENCOUNTER — Ambulatory Visit: Payer: Medicare Other | Admitting: Family Medicine

## 2015-05-02 ENCOUNTER — Telehealth: Payer: Self-pay | Admitting: *Deleted

## 2015-05-02 NOTE — Telephone Encounter (Signed)
Prior authorization for Marinol obtained and approved through 05/01/16. Case #[id number]. [facility] and patient notified. Wendall MolaJacqueline Tanish Prien

## 2015-05-25 ENCOUNTER — Emergency Department (HOSPITAL_COMMUNITY): Payer: Medicare Other

## 2015-05-25 ENCOUNTER — Emergency Department (HOSPITAL_COMMUNITY)
Admission: EM | Admit: 2015-05-25 | Discharge: 2015-05-25 | Disposition: A | Discharge status: Home / Self Care | Payer: Medicare Other | Attending: Emergency Medicine | Admitting: Emergency Medicine

## 2015-05-25 ENCOUNTER — Encounter (HOSPITAL_COMMUNITY): Payer: Self-pay | Admitting: *Deleted

## 2015-05-25 DIAGNOSIS — Z7951 Long term (current) use of inhaled steroids: Secondary | ICD-10-CM | POA: Diagnosis not present

## 2015-05-25 DIAGNOSIS — Z96643 Presence of artificial hip joint, bilateral: Secondary | ICD-10-CM | POA: Insufficient documentation

## 2015-05-25 DIAGNOSIS — S99912A Unspecified injury of left ankle, initial encounter: Secondary | ICD-10-CM | POA: Diagnosis present

## 2015-05-25 DIAGNOSIS — F17210 Nicotine dependence, cigarettes, uncomplicated: Secondary | ICD-10-CM | POA: Diagnosis not present

## 2015-05-25 DIAGNOSIS — M25572 Pain in left ankle and joints of left foot: Secondary | ICD-10-CM | POA: Diagnosis not present

## 2015-05-25 DIAGNOSIS — W1839XA Other fall on same level, initial encounter: Secondary | ICD-10-CM | POA: Insufficient documentation

## 2015-05-25 DIAGNOSIS — Z8719 Personal history of other diseases of the digestive system: Secondary | ICD-10-CM | POA: Diagnosis not present

## 2015-05-25 DIAGNOSIS — M7989 Other specified soft tissue disorders: Secondary | ICD-10-CM | POA: Diagnosis not present

## 2015-05-25 DIAGNOSIS — Y998 Other external cause status: Secondary | ICD-10-CM | POA: Diagnosis not present

## 2015-05-25 DIAGNOSIS — Z8701 Personal history of pneumonia (recurrent): Secondary | ICD-10-CM | POA: Insufficient documentation

## 2015-05-25 DIAGNOSIS — Z79899 Other long term (current) drug therapy: Secondary | ICD-10-CM | POA: Diagnosis not present

## 2015-05-25 DIAGNOSIS — Y9289 Other specified places as the place of occurrence of the external cause: Secondary | ICD-10-CM | POA: Diagnosis not present

## 2015-05-25 DIAGNOSIS — Y9389 Activity, other specified: Secondary | ICD-10-CM | POA: Diagnosis not present

## 2015-05-25 DIAGNOSIS — S93402A Sprain of unspecified ligament of left ankle, initial encounter: Secondary | ICD-10-CM | POA: Insufficient documentation

## 2015-05-25 DIAGNOSIS — B20 Human immunodeficiency virus [HIV] disease: Secondary | ICD-10-CM | POA: Diagnosis not present

## 2015-05-25 MED ORDER — IBUPROFEN 800 MG PO TABS: 800.0000 mg | ORAL_TABLET | Freq: Three times a day (TID) | ORAL | Status: DC | PRN

## 2015-05-25 NOTE — ED Provider Notes (Signed)
CSN: [id number]     Arrival date & time 05/25/15  1109 History  By signing my name below, I, [name], attest that this documentation has been prepared under the direction and in the presence of Fayrene Helper, PA-C.  Electronically Signed: [name], ED Scribe. 05/25/2015. 11:31 AM.  Chief Complaint  Patient presents with  . Ankle Pain   The history is provided by the patient. No language interpreter was used.   HPI Comments: Victor Shelton is a 39 y.o. male, with a PShx of bilateral hip arthroplasty, who presents to the Emergency Department complaining of constant, severe, non-radiating left ankle pain s/p mechanical fall that occurred 1 day ago. Pt reports he was able to ambulate throughout the day yesterday after the fall but he notes delayed onset swelling to left ankle last night. His pain is worse with weight bearing. Denies numbness, tingling, or weakness, or overlying skin changes. No fevers.   Past Medical History  Diagnosis Date  . HIV infection (HCC)   . PCP (pneumocystis carinii pneumonia) (HCC) 2005  . H/O bilateral hip replacements   . Perirectal abscess    Past Surgical History  Procedure Laterality Date  . Hernia repair    . Joint replacement     Family History  Problem Relation Age of Onset  . Mental illness Mother   . Hypertension Mother   . Hypertension Father    Social History  Substance Use Topics  . Smoking status: Current Every Day Smoker -- 0.25 packs/day    Types: Cigars, Cigarettes    Start date: 06/17/2003  . Smokeless tobacco: Never Used  . Alcohol Use: No    Review of Systems  Constitutional: Negative for fever.  Musculoskeletal: Positive for joint swelling ( left ankle) and arthralgias ( left ankle). Negative for gait problem.  Skin: Negative for color change and wound.  Neurological: Negative for weakness and numbness.   Allergies  Lactose intolerance (gi); Shellfish allergy; and Clindamycin/lincomycin  Home Medications    Prior to Admission medications   Medication Sig Start Date End Date Taking? Authorizing Provider  atazanavir-cobicistat (EVOTAZ) 300-150 MG tablet Take 1 tablet by mouth daily. Swallow whole. Do NOT crush, cut or chew tablet. Take with food. 04/09/15   Ginnie Smart, MD  dronabinol (MARINOL) 5 MG capsule Take 1 capsule (5 mg total) by mouth daily before lunch. 02/23/15   Ginnie Smart, MD  emtricitabine-tenofovir AF (DESCOVY) 200-25 MG tablet Take 1 tablet by mouth daily. 04/09/15   Ginnie Smart, MD  fluticasone (FLONASE) 50 MCG/ACT nasal spray Place 1 spray into both nostrils daily. Patient taking differently: Place 1 spray into both nostrils daily as needed for allergies.  02/23/15 04/28/15  Ginnie Smart, MD  ondansetron (ZOFRAN) 4 MG tablet Take 1 tablet (4 mg total) by mouth every 8 (eight) hours as needed for nausea or vomiting. Patient not taking: Reported on 04/09/2015 03/15/15   Joni Reining Pisciotta, PA-C  terbinafine (LAMISIL) 250 MG tablet Take 1 tablet (250 mg total) by mouth daily. 04/09/15   Ginnie Smart, MD   BP 122/73 mmHg  Pulse 73  Temp(Src) 98.2 F (36.8 C) (Oral)  Resp 18  SpO2 97% Physical Exam  Constitutional: He is oriented to person, place, and time. He appears well-developed and well-nourished. No distress.  HENT:  Head: Normocephalic.  Eyes: Conjunctivae are normal.  Neck: Normal range of motion. Neck supple.  Cardiovascular: Normal rate.   Pulmonary/Chest: Effort normal. No respiratory distress.  Musculoskeletal:  Normal range of motion. He exhibits edema and tenderness.  Left ankle; tenderness to lateral malleolus and medial ankle, with no crepitus, edema noted to foot; decreased dorsiflexion and plantar flexion and inversion and eversion of left ankle secondary to pain, no pain to 5th metatarsal, DP and TP intact; capillary refill less than 1 second, NVI, strength and sensation intact.   Neurological: He is alert and oriented to person, place,  and time. Coordination normal.  Skin: Skin is warm.  Psychiatric: He has a normal mood and affect. His behavior is normal.  Nursing note and vitals reviewed.   ED Course  Procedures  DIAGNOSTIC STUDIES: Oxygen Saturation is 97% on RA, normal by my interpretation.    COORDINATION OF CARE: 11:30 AM Suspect left ankle sprain however will order imaging of left ankle. Pt acknowledges and agrees to plan.   Imaging Review Dg Ankle Complete Left  05/25/2015  CLINICAL DATA:  Twisted ankle while moving an appliance yesterday. Inversion injury. Lateral pain and swelling. EXAM: LEFT ANKLE COMPLETE - 3+ VIEW COMPARISON:  None. FINDINGS: There is mild lateral and anterior swelling. Questioned tiny avulsion fracture of the lateral talus that could go along with a minimal inversion injury. No other finding. IMPRESSION: Soft tissue swelling. Question tiny avulsion fracture of the lateral talus. Electronically Signed   By: Paulina FusiMark  Shogry M.D.   On: 05/25/2015 12:20   I have personally reviewed and evaluated these images as part of my medical decision-making.   MDM   Patient X-Ray negative for obvious fracture or dislocation.  Question tiny avulsion fx of the lateral talus.  Pt advised to follow up with orthopedics. Patient given ASO while in ED, conservative therapy recommended and discussed. Patient will be discharged home & is agreeable with above plan. Returns precautions discussed. Pt appears Shelton for discharge.  Final diagnoses:  Left ankle sprain, initial encounter    BP 122/73 mmHg  Pulse 73  Temp(Src) 98.2 F (36.8 C) (Oral)  Resp 18  SpO2 97%   I personally performed the services described in this documentation, which was scribed in my presence. The recorded information has been reviewed and is accurate.      Fayrene HelperBowie Amarra Sawyer, PA-C 05/25/15 1227  Leta BaptistEmily Roe Nguyen, MD 05/26/15 [phone]

## 2015-05-25 NOTE — ED Notes (Signed)
Pt reports falling yesterday and having left ankle pain since his fall.

## 2015-05-25 NOTE — Discharge Instructions (Signed)
Ankle Sprain  An ankle sprain is an injury to the strong, fibrous tissues (ligaments) that hold the bones of your ankle joint together.   CAUSES  An ankle sprain is usually caused by a fall or by twisting your ankle. Ankle sprains most commonly occur when you step on the outer edge of your foot, and your ankle turns inward. People who participate in sports are more prone to these types of injuries.   SYMPTOMS    Pain in your ankle. The pain may be present at rest or only when you are trying to stand or walk.   Swelling.   Bruising. Bruising may develop immediately or within 1 to 2 days after your injury.   Difficulty standing or walking, particularly when turning corners or changing directions.  DIAGNOSIS   Your caregiver will ask you details about your injury and perform a physical exam of your ankle to determine if you have an ankle sprain. During the physical exam, your caregiver will press on and apply pressure to specific areas of your foot and ankle. Your caregiver will try to move your ankle in certain ways. An X-ray exam may be done to be sure a bone was not broken or a ligament did not separate from one of the bones in your ankle (avulsion fracture).   TREATMENT   Certain types of braces can help stabilize your ankle. Your caregiver can make a recommendation for this. Your caregiver may recommend the use of medicine for pain. If your sprain is severe, your caregiver may refer you to a surgeon who helps to restore function to parts of your skeletal system (orthopedist) or a physical therapist.  HOME CARE INSTRUCTIONS    Apply ice to your injury for 1-2 days or as directed by your caregiver. Applying ice helps to reduce inflammation and pain.    Put ice in a plastic bag.    Place a towel between your skin and the bag.    Leave the ice on for 15-20 minutes at a time, every 2 hours while you are awake.   Only take over-the-counter or prescription medicines for pain, discomfort, or fever as directed by  your caregiver.   Elevate your injured ankle above the level of your heart as much as possible for 2-3 days.   If your caregiver recommends crutches, use them as instructed. Gradually put weight on the affected ankle. Continue to use crutches or a cane until you can walk without feeling pain in your ankle.   If you have a plaster splint, wear the splint as directed by your caregiver. Do not rest it on anything harder than a pillow for the first 24 hours. Do not put weight on it. Do not get it wet. You may take it off to take a shower or bath.   You may have been given an elastic bandage to wear around your ankle to provide support. If the elastic bandage is too tight (you have numbness or tingling in your foot or your foot becomes cold and blue), adjust the bandage to make it comfortable.   If you have an air splint, you may blow more air into it or let air out to make it more comfortable. You may take your splint off at night and before taking a shower or bath. Wiggle your toes in the splint several times per day to decrease swelling.  SEEK MEDICAL CARE IF:    You have rapidly increasing bruising or swelling.   Your toes feel   extremely cold or you lose feeling in your foot.   Your pain is not relieved with medicine.  SEEK IMMEDIATE MEDICAL CARE IF:   Your toes are numb or blue.   You have severe pain that is increasing.  MAKE SURE YOU:    Understand these instructions.   Will watch your condition.   Will get help right away if you are not doing well or get worse.     This information is not intended to replace advice given to you by your health care provider. Make sure you discuss any questions you have with your health care provider.     Document Released: 06/02/2005 Document Revised: 06/23/2014 Document Reviewed: 06/14/2011  [employer] Interactive Patient Education 2016 [employer]

## 2015-08-08 ENCOUNTER — Ambulatory Visit: Payer: Medicare Other | Admitting: Infectious Diseases

## 2015-08-16 ENCOUNTER — Ambulatory Visit: Payer: Medicare Other

## 2015-09-03 ENCOUNTER — Other Ambulatory Visit: Payer: Medicare Other

## 2015-09-17 ENCOUNTER — Ambulatory Visit: Payer: Medicare Other | Admitting: Infectious Diseases

## 2015-09-17 ENCOUNTER — Encounter (HOSPITAL_COMMUNITY): Payer: Self-pay | Admitting: Nurse Practitioner

## 2015-09-17 ENCOUNTER — Emergency Department (HOSPITAL_COMMUNITY): Payer: Medicare Other

## 2015-09-17 ENCOUNTER — Emergency Department (HOSPITAL_COMMUNITY)
Admission: EM | Admit: 2015-09-17 | Discharge: 2015-09-17 | Disposition: A | Discharge status: Home / Self Care | Payer: Medicare Other | Attending: Emergency Medicine | Admitting: Emergency Medicine

## 2015-09-17 DIAGNOSIS — Z79899 Other long term (current) drug therapy: Secondary | ICD-10-CM | POA: Insufficient documentation

## 2015-09-17 DIAGNOSIS — M25551 Pain in right hip: Secondary | ICD-10-CM | POA: Diagnosis present

## 2015-09-17 DIAGNOSIS — Z8619 Personal history of other infectious and parasitic diseases: Secondary | ICD-10-CM | POA: Insufficient documentation

## 2015-09-17 DIAGNOSIS — Z96643 Presence of artificial hip joint, bilateral: Secondary | ICD-10-CM | POA: Insufficient documentation

## 2015-09-17 DIAGNOSIS — F17210 Nicotine dependence, cigarettes, uncomplicated: Secondary | ICD-10-CM | POA: Insufficient documentation

## 2015-09-17 DIAGNOSIS — M67351 Transient synovitis, right hip: Secondary | ICD-10-CM | POA: Insufficient documentation

## 2015-09-17 DIAGNOSIS — Z8719 Personal history of other diseases of the digestive system: Secondary | ICD-10-CM | POA: Insufficient documentation

## 2015-09-17 DIAGNOSIS — B20 Human immunodeficiency virus [HIV] disease: Secondary | ICD-10-CM | POA: Diagnosis not present

## 2015-09-17 MED ORDER — NAPROXEN 500 MG PO TABS: 500.0000 mg | ORAL_TABLET | Freq: Two times a day (BID) | ORAL | Status: DC

## 2015-09-17 NOTE — ED Provider Notes (Signed)
CSN: [id number]     Arrival date & time 09/17/15  1347 History  By signing my name below, I, Tanda Rockers, attest that this documentation has been prepared under the direction and in the presence of [employer], PA-C. Electronically Signed: Tanda Rockers, ED Scribe. 09/17/2015. 3:32 PM.   Chief Complaint  Patient presents with  . Hip Pain   The history is provided by the patient. No language interpreter was used.     HPI Comments: Kavon Valenza is a 40 y.o. 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 The pain is exacerbated with walking, bending, and prolonged sitting. No known injury tor fall but pt does report that he has been attending a gym class at the [facility] and could have strained the hip while running. Denies weakness, numbness, tingling, or any other associated symptoms. Pt's last viral load was undetectable in December 2016. He has an appointment in 2 weeks to have it rechecked.   Past Medical History  Diagnosis Date  . HIV infection (HCC)   . PCP (pneumocystis carinii pneumonia) (HCC) 2005  . H/O bilateral hip replacements   . Perirectal abscess    Past Surgical History  Procedure Laterality Date  . Hernia repair    . Joint replacement     Family History  Problem Relation Age of Onset  . Mental illness Mother   . Hypertension Mother   . Hypertension Father    Social History  Substance Use Topics  . Smoking status: Current Every Day Smoker -- 0.25 packs/day    Types: Cigars, Cigarettes    Start date: 06/17/2003  . Smokeless tobacco: Never Used  . Alcohol Use: No    Review of Systems  Musculoskeletal: Positive for arthralgias (right hip).  Neurological: Negative for weakness and numbness.    Allergies  Lactose intolerance (gi); Shellfish allergy; and Clindamycin/lincomycin  Home Medications   Prior to  Admission medications   Medication Sig Start Date End Date Taking? Authorizing Provider  atazanavir-cobicistat (EVOTAZ) 300-150 MG tablet Take 1 tablet by mouth daily. Swallow whole. Do NOT crush, cut or chew tablet. Take with food. 04/09/15   Ginnie Smart, MD  dronabinol (MARINOL) 5 MG capsule Take 1 capsule (5 mg total) by mouth daily before lunch. 02/23/15   Ginnie Smart, MD  emtricitabine-tenofovir AF (DESCOVY) 200-25 MG tablet Take 1 tablet by mouth daily. 04/09/15   Ginnie Smart, MD  fluticasone (FLONASE) 50 MCG/ACT nasal spray Place 1 spray into both nostrils daily. Patient taking differently: Place 1 spray into both nostrils daily as needed for allergies.  02/23/15 04/28/15  Ginnie Smart, MD  ibuprofen (ADVIL,MOTRIN) 800 MG tablet Take 1 tablet (800 mg total) by mouth every 8 (eight) hours as needed for moderate pain. 05/25/15   Fayrene Helper, PA-C  naproxen (NAPROSYN) 500 MG tablet Take 1 tablet (500 mg total) by mouth 2 (two) times daily. 09/17/15   Rodger Giangregorio Patel-Mills, PA-C  ondansetron (ZOFRAN) 4 MG tablet Take 1 tablet (4 mg total) by mouth every 8 (eight) hours as needed for nausea or vomiting. Patient not taking: Reported on 04/09/2015 03/15/15   Joni Reining Pisciotta, PA-C  terbinafine (LAMISIL) 250 MG tablet Take 1 tablet (250 mg total) by mouth daily. 04/09/15   Ginnie Smart, MD   BP 116/68 mmHg  Pulse 74  Temp(Src) 98.4 F (36.9 C) (Oral)  Resp  16  SpO2 100%   Physical Exam  Constitutional: He is oriented to person, place, and time. He appears well-developed and well-nourished. No distress.  HENT:  Head: Normocephalic and atraumatic.  Eyes: Conjunctivae and EOM are normal.  Neck: Neck supple. No tracheal deviation present.  Cardiovascular: Normal rate.   Pulmonary/Chest: Effort normal. No respiratory distress.  Musculoskeletal: Normal range of motion. He exhibits tenderness.  Reproducible right sided tenderness over the right buttocks and lateral hip. No  reproducible knee pain. Ambulatory with steady gait. Able to flex and extend at the hip. Pt walking around room in no obvious pain.  No rash or ecchymosis.   Neurological: He is alert and oriented to person, place, and time.  Skin: Skin is warm and dry. No rash noted.  Psychiatric: He has a normal mood and affect. His behavior is normal.  Nursing note and vitals reviewed.   ED Course  Procedures (including critical care time)  DIAGNOSTIC STUDIES: Oxygen Saturation is 100% on RA, normal by my interpretation.    COORDINATION OF CARE: 3:30 PM-Discussed treatment plan which includes DG R Hip with pt at bedside and pt agreed to plan.   Labs Review Labs Reviewed - No data to display  Imaging Review Dg Hip Unilat With Pelvis 2-3 Views Right  09/17/2015  CLINICAL DATA:  History of avascular necrosis and bilateral hip replacement, gradual onset of constant right hip pain for 2 weeks with no known injury EXAM: DG HIP (WITH OR WITHOUT PELVIS) 2-3V RIGHT COMPARISON:  None FINDINGS: Pelvic bones intact. Bilateral hip replacements with components in anticipated position. No acute osseous abnormalities. Soft tissue fullness laterally over the right hip joint suggests synovitis or effusion. IMPRESSION: Suspect mild right hip joint effusion or synovitis. Electronically Signed   By: Esperanza Heiraymond  Rubner M.D.   On: 09/17/2015 15:54   I have personally reviewed and evaluated these images as part of my medical decision-making.   EKG Interpretation None      MDM   Final diagnoses:  Transient synovitis of right hip  Patient presents for right hip pain.  He has a history of bilateral hip replacements due to AVS.  He has HIV. His vitals are stable and he is afebrile.  He is ambulatory and only has pain with certain movements.  Has gym class at school which requires running and lifting weights.  He thinks this may have aggravated the hip. Xray of the right hip shows mild right hip joint effusion or synovitis.  I  discussed this patient with Dr. Denton LankSteinl.  He agrees with plan to follow up with orthopedics.  I do not believe this is infectious.  Patient agrees with plan.  Return precautions were discussed at length.  Filed Vitals:   09/17/15 1414 09/17/15 1622  BP: 127/88 116/68  Pulse: 81 74  Temp: 98.4 F (36.9 C) 98.4 F (36.9 C)  Resp: 18 16     I personally performed the services described in this documentation, which was scribed in my presence. The recorded information has been reviewed and is accurate.      Catha GosselinHanna Patel-Mills, PA-C 09/18/15 1228  Cathren LaineKevin Steinl, MD 09/19/15 1501

## 2015-09-17 NOTE — ED Notes (Signed)
Pt reports 2 week history of increasingly worse R lower back/hip pain. Pain is worse with movement.  Hes had bilateral hip replacements. A&Ox4, resp e/u

## 2015-09-17 NOTE — Discharge Instructions (Signed)
Follow-up with an orthopedist. Return for fever or excruciating pain. Take naproxen as an anti-inflammatory until you see your primary care provider or orthopedist.

## 2015-10-01 ENCOUNTER — Other Ambulatory Visit: Payer: Self-pay | Admitting: Infectious Diseases

## 2015-10-24 ENCOUNTER — Other Ambulatory Visit: Payer: Medicare Other

## 2015-10-24 DIAGNOSIS — B20 Human immunodeficiency virus [HIV] disease: Secondary | ICD-10-CM

## 2015-10-24 LAB — CBC
HEMATOCRIT: 42.2 % (ref 38.5–50.0)
Hemoglobin: 13.8 g/dL (ref 13.2–17.1)
MCH: 30.4 pg (ref 27.0–33.0)
MCHC: 32.7 g/dL (ref 32.0–36.0)
MCV: 93 fL (ref 80.0–100.0)
MPV: 11 fL (ref 7.5–12.5)
PLATELETS: 221 10*3/uL (ref 140–400)
RBC: 4.54 MIL/uL (ref 4.20–5.80)
RDW: 14.7 % (ref 11.0–15.0)
WBC: 5.2 10*3/uL (ref 3.8–10.8)

## 2015-10-25 LAB — COMPREHENSIVE METABOLIC PANEL
ALT: 37 U/L (ref 9–46)
AST: 44 U/L — AB (ref 10–40)
Albumin: 3.9 g/dL (ref 3.6–5.1)
Alkaline Phosphatase: 52 U/L (ref 40–115)
BILIRUBIN TOTAL: 0.3 mg/dL (ref 0.2–1.2)
BUN: 10 mg/dL (ref 7–25)
CALCIUM: 9.4 mg/dL (ref 8.6–10.3)
CHLORIDE: 104 mmol/L (ref 98–110)
CO2: 27 mmol/L (ref 20–31)
CREATININE: 0.98 mg/dL (ref 0.60–1.35)
GLUCOSE: 79 mg/dL (ref 65–99)
Potassium: 4.1 mmol/L (ref 3.5–5.3)
SODIUM: 138 mmol/L (ref 135–146)
Total Protein: 6.9 g/dL (ref 6.1–8.1)

## 2015-10-25 LAB — FLUORESCENT TREPONEMAL AB(FTA)-IGG-BLD: Fluorescent Treponemal ABS: REACTIVE — AB

## 2015-10-25 LAB — T-HELPER CELL (CD4) - (RCID CLINIC ONLY)
CD4 % Helper T Cell: 6 % — ABNORMAL LOW (ref 33–55)
CD4 T Cell Abs: 110 /uL — ABNORMAL LOW (ref 400–2700)

## 2015-10-25 LAB — RPR: RPR Ser Ql: REACTIVE — AB

## 2015-10-25 LAB — HIV-1 RNA QUANT-NO REFLEX-BLD
HIV 1 RNA QUANT: 42826 {copies}/mL — AB (ref <20)
HIV-1 RNA Quant, Log: 4.63 Log copies/mL — ABNORMAL HIGH (ref <1.30)

## 2015-10-25 LAB — RPR TITER: RPR Titer: 1:8 {titer}

## 2015-10-30 ENCOUNTER — Ambulatory Visit: Payer: Medicare Other

## 2015-10-30 DIAGNOSIS — F4310 Post-traumatic stress disorder, unspecified: Secondary | ICD-10-CM

## 2015-10-30 DIAGNOSIS — F331 Major depressive disorder, recurrent, moderate: Secondary | ICD-10-CM
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
## 2015-11-06 ENCOUNTER — Ambulatory Visit: Payer: Medicare Other

## 2015-11-06 DIAGNOSIS — F4310 Post-traumatic stress disorder, unspecified: Secondary | ICD-10-CM
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
## 2015-11-15 ENCOUNTER — Ambulatory Visit: Payer: Medicare Other

## 2015-11-19 ENCOUNTER — Ambulatory Visit: Payer: Medicare Other | Admitting: Infectious Diseases

## 2015-11-20 ENCOUNTER — Other Ambulatory Visit: Payer: Medicare Other

## 2015-11-22 ENCOUNTER — Ambulatory Visit: Payer: Medicare Other

## 2015-11-26 ENCOUNTER — Ambulatory Visit (INDEPENDENT_AMBULATORY_CARE_PROVIDER_SITE_OTHER): Payer: Medicare Other | Admitting: Infectious Diseases

## 2015-11-26 ENCOUNTER — Encounter: Payer: Self-pay | Admitting: Infectious Diseases

## 2015-11-26 VITALS — BP 137/79 | HR 71 | Temp 97.5°F | Wt 130.0 lb

## 2015-11-26 DIAGNOSIS — F4310 Post-traumatic stress disorder, unspecified: Secondary | ICD-10-CM | POA: Diagnosis not present

## 2015-11-26 DIAGNOSIS — B20 Human immunodeficiency virus [HIV] disease: Secondary | ICD-10-CM | POA: Diagnosis not present

## 2015-11-26 DIAGNOSIS — M25531 Pain in right wrist: Secondary | ICD-10-CM

## 2015-11-26 NOTE — Assessment & Plan Note (Signed)
Will recheck his HIV RNA and genotype today.  Offered/refused condoms.  Will see him back in 3-4 months.  [facility], [facility] on [facility].

## 2015-11-26 NOTE — Progress Notes (Signed)
   Subjective:    Patient ID: Victor Shelton, male    DOB: April 06, 1976, 40 y.o.   MRN: [id number]  HPI Victor Shelton is a 40 year old male diagnosed with HIV.  His ART regimen consists of Evotaz and Descovy.  He reports that he had been off his medication for about a week related to multiple factors.  However, he reports that he began to re-taking his medication about 4-5 days ago around dinner time.  He denies any side effects from his medications.  Additionally, he has been seeing Bernette RedbirdKenny for PTSD.  He is seeing a Sports coachCase Manager for housing assistance.  Patient seen in clinic for routine follow-up with complaints of pain/cramping to right hand.  He denies any aggravating/alleviating factors.  He has not taken an OTCs for pain. Lab Results  Component Value Date   HIV1RNAQUANT 42826* 10/24/2015   Lab Results  Component Value Date   CD4TABS 110* 10/24/2015   CD4TABS 100* 04/09/2015   CD4TABS 80* 01/25/2015    Review of Systems  Constitutional: Negative for fever and appetite change.  HENT: Negative for mouth sores.   Respiratory: Negative for cough and shortness of breath.   Cardiovascular: Negative for chest pain.  Gastrointestinal: Negative for nausea, abdominal pain and diarrhea.  Genitourinary: Negative for discharge and genital sores.  Skin: Negative for rash.  Neurological: Negative for headaches.      Objective:   Physical Exam  Constitutional: He is oriented to person, place, and time. He appears well-developed and well-nourished.  Cardiovascular: Normal rate, regular rhythm and normal heart sounds.   No murmur heard. Pulmonary/Chest: Effort normal and breath sounds normal. No respiratory distress. He has no wheezes.  Abdominal: Soft. Bowel sounds are normal. He exhibits no distension. There is no tenderness.  Lymphadenopathy:    He has no cervical adenopathy.  Neurological: He is alert and oriented to person, place, and time.      Assessment & Plan:

## 2015-11-26 NOTE — Progress Notes (Signed)
   Subjective:    Patient ID: Victor Shelton, male    DOB: February 16, 1976, [age] y.o.   MRN: [id number]  HPI Pt seen with NP student. Please see their note for complete details.  Pt with non-compliance with HIV rx.  Has f/u with Bernette RedbirdKenny for PTSD.  Complains of R hand pain- over 3rd MT. No hx of trauma. No OTCs.    HIV 1 RNA QUANT (copies/mL)  Date Value  10/24/2015 42826*  04/09/2015 4474*  01/25/2015 20900*   CD4 T CELL ABS (/uL)  Date Value  10/24/2015 110*  04/09/2015 100*  01/25/2015 80*     Review of Systems     Objective:   Physical Exam  Constitutional: He appears well-developed and well-nourished.  HENT:  Mouth/Throat: No oropharyngeal exudate.  Eyes: EOM are normal. Pupils are equal, round, and reactive to light.  Neck: Neck supple.  Cardiovascular: Normal rate, regular rhythm and normal heart sounds.   Pulmonary/Chest: Effort normal and breath sounds normal.  Abdominal: Soft. Bowel sounds are normal. There is no tenderness. There is no rebound.  Musculoskeletal: He exhibits no edema.  Lymphadenopathy:    He has no cervical adenopathy.          Assessment & Plan:

## 2015-11-26 NOTE — Assessment & Plan Note (Signed)
Trial of OTCs.  I feel no defect on exam. His strength is normal, limited only by pain.

## 2015-11-26 NOTE — Assessment & Plan Note (Signed)
F/u appt on 6-19 9am

## 2015-11-27 LAB — HIV-1 RNA ULTRAQUANT REFLEX TO GENTYP+
HIV 1 RNA QUANT: 33031 {copies}/mL — AB (ref <20)
HIV-1 RNA Quant, Log: 4.52 Log copies/mL — ABNORMAL HIGH (ref <1.30)

## 2015-11-27 NOTE — Addendum Note (Signed)
Addended by: Mariea ClontsGREEN, Louisa Favaro D on: 11/27/2015 03:18 PM   Modules accepted: Orders

## 2015-11-28 LAB — T-HELPER CELL (CD4) - (RCID CLINIC ONLY)
CD4 T CELL HELPER: 8 % — AB (ref 33–55)
CD4 T Cell Abs: 120 /uL — ABNORMAL LOW (ref 400–2700)

## 2015-12-03 ENCOUNTER — Ambulatory Visit: Payer: Medicare Other

## 2015-12-11 LAB — HIV-1 GENOTYPR PLUS

## 2016-01-04 ENCOUNTER — Ambulatory Visit: Payer: Medicare Other | Admitting: *Deleted

## 2016-01-14 ENCOUNTER — Ambulatory Visit: Payer: Medicare Other | Admitting: *Deleted

## 2016-01-17 ENCOUNTER — Ambulatory Visit: Payer: Medicare Other | Admitting: *Deleted

## 2016-02-13 ENCOUNTER — Other Ambulatory Visit: Payer: Medicare Other

## 2016-02-26 ENCOUNTER — Ambulatory Visit: Payer: Medicare Other | Admitting: *Deleted

## 2016-02-27 ENCOUNTER — Ambulatory Visit: Payer: Medicare Other | Admitting: Infectious Diseases

## 2016-04-20 ENCOUNTER — Encounter (HOSPITAL_COMMUNITY): Payer: Self-pay

## 2016-04-20 ENCOUNTER — Emergency Department (HOSPITAL_COMMUNITY)
Admission: EM | Admit: 2016-04-20 | Discharge: 2016-04-20 | Disposition: A | Discharge status: Home / Self Care | Payer: Medicare Other | Attending: Emergency Medicine | Admitting: Emergency Medicine

## 2016-04-20 DIAGNOSIS — F17210 Nicotine dependence, cigarettes, uncomplicated: Secondary | ICD-10-CM | POA: Insufficient documentation

## 2016-04-20 DIAGNOSIS — L02413 Cutaneous abscess of right upper limb: Secondary | ICD-10-CM | POA: Insufficient documentation

## 2016-04-20 DIAGNOSIS — Z9660 Presence of unspecified orthopedic joint implant: Secondary | ICD-10-CM | POA: Insufficient documentation

## 2016-04-20 DIAGNOSIS — L02411 Cutaneous abscess of right axilla: Secondary | ICD-10-CM | POA: Diagnosis not present

## 2016-04-20 DIAGNOSIS — L0291 Cutaneous abscess, unspecified: Secondary | ICD-10-CM

## 2016-04-20 MED ORDER — CEPHALEXIN 500 MG PO CAPS: 500.0000 mg | ORAL_CAPSULE | Freq: Two times a day (BID) | ORAL | 0 refills | Status: AC

## 2016-04-20 MED ORDER — DOXYCYCLINE HYCLATE 100 MG PO CAPS: 100.0000 mg | ORAL_CAPSULE | Freq: Two times a day (BID) | ORAL | 0 refills | Status: AC

## 2016-04-20 MED ORDER — CEPHALEXIN 250 MG PO CAPS
500.0000 mg | ORAL_CAPSULE | Freq: Once | ORAL | Status: AC
  Administered 2016-04-20: 500 mg via ORAL
  Filled 2016-04-20: qty 2

## 2016-04-20 MED ORDER — DOXYCYCLINE HYCLATE 100 MG PO TABS
100.0000 mg | ORAL_TABLET | Freq: Once | ORAL | Status: AC
  Administered 2016-04-20: 100 mg via ORAL
  Filled 2016-04-20: qty 1

## 2016-04-20 NOTE — ED Triage Notes (Signed)
Pt states that for the past week he has been having boils under his R arm, pt has tried using different deodorants without relief. No drainage.

## 2016-04-20 NOTE — ED Provider Notes (Signed)
[facility]-EMERGENCY DEPT Provider Note   CSN: [id number] Arrival date & time: 04/20/16  [id number]     History   Chief Complaint Chief Complaint  Patient presents with  . Abscess    HPI Victor Shelton is a 40 y.o. male PMH of HIV and perirectal abscess here with concern for abscess in his R armpit.  He states it has been there for over 3 weeks now and he has been treating at home with rubbing alcohol and steroid creams.  He denies any drainage or fevers.  He has not shaven. He changed his deodorant and he thinks that has helped as well.  There are no further complaints.  10 Systems reviewed and are negative for acute change except as noted in the HPI.   HPI  Past Medical History:  Diagnosis Date  . H/O bilateral hip replacements   . HIV infection (HCC)   . PCP (pneumocystis carinii pneumonia) (HCC) 2005  . Perirectal abscess     Patient Active Problem List   Diagnosis Date Noted  . PTSD (post-traumatic stress disorder) 11/26/2015  . Dermatitis 04/09/2015  . Back pain 02/23/2015  . Syphilis 02/23/2015  . Seasonal allergies 02/23/2015  . Right wrist pain 02/23/2015  . HIV infection, symptomatic (HCC) 02/12/2015    Past Surgical History:  Procedure Laterality Date  . HERNIA REPAIR    . JOINT REPLACEMENT         Home Medications    Prior to Admission medications   Medication Sig Start Date End Date Taking? Authorizing Provider  atazanavir-cobicistat (EVOTAZ) 300-150 MG tablet Take 1 tablet by mouth daily. Swallow whole. Do NOT crush, cut or chew tablet. Take with food. 04/09/15  Yes Ginnie SmartJeffrey C Hatcher, MD  emtricitabine-tenofovir AF (DESCOVY) 200-25 MG tablet Take 1 tablet by mouth daily. 04/09/15  Yes Ginnie SmartJeffrey C Hatcher, MD  cephALEXin (KEFLEX) 500 MG capsule Take 1 capsule (500 mg total) by mouth 2 (two) times daily. 04/20/16   Tomasita CrumbleAdeleke Nazyia Gaugh, MD  doxycycline (VIBRAMYCIN) 100 MG capsule Take 1 capsule (100 mg total) by mouth 2 (two) times daily. One po bid x 7 days 04/20/16    Tomasita CrumbleAdeleke Saharra Santo, MD    Family History Family History  Problem Relation Age of Onset  . Mental illness Mother   . Hypertension Mother   . Hypertension Father     Social History Social History  Substance Use Topics  . Smoking status: Current Every Day Smoker    Packs/day: 0.25    Types: Cigars, Cigarettes    Start date: 06/17/2003  . Smokeless tobacco: Never Used  . Alcohol use No     Allergies   Lactose intolerance (gi); Shellfish allergy; and Clindamycin/lincomycin   Review of Systems Review of Systems   Physical Exam Updated Vital Signs BP 112/74   Pulse 86   Temp 98.5 F (36.9 C)   Resp 18   Ht 5\' 8"  (1.727 m)   Wt 132 lb (59.9 kg)   SpO2 100%   BMI 20.07 kg/m   Physical Exam  Constitutional: He is oriented to person, place, and time. Vital signs are normal. He appears well-developed and well-nourished.  Non-toxic appearance. He does not appear ill. No distress.  HENT:  Head: Normocephalic and atraumatic.  Nose: Nose normal.  Mouth/Throat: Oropharynx is clear and moist. No oropharyngeal exudate.  Eyes: Conjunctivae and EOM are normal. Pupils are equal, round, and reactive to light. No scleral icterus.  Neck: Normal range of motion. Neck supple. No tracheal deviation, no  edema, no erythema and normal range of motion present. No thyroid mass and no thyromegaly present.  Cardiovascular: Normal rate, regular rhythm, S1 normal, S2 normal, normal heart sounds, intact distal pulses and normal pulses.  Exam reveals no gallop and no friction rub.   No murmur heard. Pulmonary/Chest: Effort normal and breath sounds normal. No respiratory distress. He has no wheezes. He has no rhonchi. He has no rales.  Abdominal: Soft. Normal appearance and bowel sounds are normal. He exhibits no distension, no ascites and no mass. There is no hepatosplenomegaly. There is no tenderness. There is no rebound, no guarding and no CVA tenderness.  Musculoskeletal: Normal range of motion. He  exhibits no edema or tenderness.  Lymphadenopathy:    He has no cervical adenopathy.  Neurological: He is alert and oriented to person, place, and time. He has normal strength. No cranial nerve deficit or sensory deficit.  Skin: Skin is warm, dry and intact. No petechiae and no rash noted. He is not diaphoretic. No erythema. No pallor.  3 lumps felt under the R arm pit, no fluctuance seen, mild TTP  Nursing note and vitals reviewed.    ED Treatments / Results  Labs (all labs ordered are listed, but only abnormal results are displayed) Labs Reviewed - No data to display  EKG  EKG Interpretation None       Radiology No results found.  Procedures Procedures (including critical care time)  Medications Ordered in ED Medications  cephALEXin (KEFLEX) capsule 500 mg (not administered)  doxycycline (VIBRA-TABS) tablet 100 mg (not administered)     Initial Impression / Assessment and Plan / ED Course  I have reviewed the triage vital signs and the nursing notes.  Pertinent labs & imaging results that were available during my care of the patient were reviewed by me and considered in my medical decision making (see chart for details).  Clinical Course     Patient presents to the ED for possible abscess under armpit.  It has been there for 3 weeks and there is no drainage.  On exam, it is more consistent with ingrown hairs.  Will DC with antibiotic coverage and PCP fu for wound check.  Home care instructions provided as well.  He appears well and in NAD. VS remain within his normal limits and he is safe for DC.  Final Clinical Impressions(s) / ED Diagnoses   Final diagnoses:  Abscess    New Prescriptions New Prescriptions   CEPHALEXIN (KEFLEX) 500 MG CAPSULE    Take 1 capsule (500 mg total) by mouth 2 (two) times daily.   DOXYCYCLINE (VIBRAMYCIN) 100 MG CAPSULE    Take 1 capsule (100 mg total) by mouth 2 (two) times daily. One po bid x 7 days     Tomasita CrumbleAdeleke Andora Krull, MD 04/20/16  [phone]

## 2016-04-24 ENCOUNTER — Encounter (INDEPENDENT_AMBULATORY_CARE_PROVIDER_SITE_OTHER): Payer: Self-pay

## 2016-04-24 NOTE — Progress Notes (Signed)
Phone Intake Interview  Referral  Date of initial contact: 04-24-16  Referral Source: Himself      Reason for referral: HIV Positive    (Medical, Dental, Mental Health, Co-payment assistance)    Demographics  . Client was instructed to bring in his photo ID, verification of address, proof of income (SSI award letter), medical insurance card, HIV medication and western blot.   Legal name: [name]                              Preferred name: Henry Shannon         Race: African American      Date of Birth: [birth year]   Address: [address], [place], [place] [address]   Primary contact #: [phone]   Emergency contact name and #: Theodis Blaze ([phone]   Primary language spoken: [place]    Interpreter needed: yes/no   Arranged ________________________________ (company name and date)    Other special needs that need assistance with: Client does not require special needs.   Household income: $959 monthly              per (Annual/Monthly/Weekly)   . If no income what is source of support: Client states he financially supports himself with his SSDI.   Marland Kitchen People in household: Client resides with his boyfriend.        Marital status: Single    Employment status: Community education officer Name: Medicare (Client has [facility] but he was instructed to terminate it and apply for [place])   . Policy Number/Identification Number: [id number]  . Group Number: No Group Number  Medical History-from doctor's office or hospital  Date of HIV diagnosis: 2005     Previous or current HIV care: Client received moved to [place] from [place]. Client previously received his HIV care at [facility] in [place].         HIV medication(s): Client states he is currently taking descovy and evotaz and has 17 days left of medication.        Hospitalized within the last 30 days: Client states he was previously seen at a hospital in [place] in the past 30 days.   Pregnant status    Yes/No? N/A.    Other pertinent health/ mental health diagnosis that you will like to share: Client states he does not have any other health problems and no history of mental illness.      Primary IJP Site  Site: [facility] Intake Appointment:  Eligibility Visit: May 01, 2016 at 1:00 PM with Essie Christine  Nurse Visit: May 01, 2016 at 2:00 PM with Vista Mink

## 2016-05-01 ENCOUNTER — Encounter (INDEPENDENT_AMBULATORY_CARE_PROVIDER_SITE_OTHER): Payer: Self-pay

## 2016-05-01 ENCOUNTER — Ambulatory Visit (INDEPENDENT_AMBULATORY_CARE_PROVIDER_SITE_OTHER): Payer: Self-pay

## 2016-05-08 DIAGNOSIS — Z21 Asymptomatic human immunodeficiency virus [HIV] infection status: Secondary | ICD-10-CM | POA: Diagnosis not present

## 2016-05-08 DIAGNOSIS — M7989 Other specified soft tissue disorders: Secondary | ICD-10-CM | POA: Diagnosis not present

## 2016-05-08 DIAGNOSIS — L02413 Cutaneous abscess of right upper limb: Secondary | ICD-10-CM | POA: Diagnosis not present

## 2016-05-08 DIAGNOSIS — M79601 Pain in right arm: Secondary | ICD-10-CM | POA: Diagnosis not present

## 2016-05-10 DIAGNOSIS — F17200 Nicotine dependence, unspecified, uncomplicated: Secondary | ICD-10-CM | POA: Diagnosis not present

## 2016-05-10 DIAGNOSIS — Z4800 Encounter for change or removal of nonsurgical wound dressing: Secondary | ICD-10-CM | POA: Diagnosis not present

## 2016-05-10 DIAGNOSIS — Z4801 Encounter for change or removal of surgical wound dressing: Secondary | ICD-10-CM | POA: Diagnosis not present

## 2016-05-12 ENCOUNTER — Ambulatory Visit (INDEPENDENT_AMBULATORY_CARE_PROVIDER_SITE_OTHER): Payer: Medicare Other | Admitting: Family

## 2016-06-19 ENCOUNTER — Encounter (INDEPENDENT_AMBULATORY_CARE_PROVIDER_SITE_OTHER): Payer: Self-pay

## 2016-06-19 NOTE — Progress Notes (Signed)
Phone Intake Interview  Referral  Date of initial contact: 06-19-16  Referral Source: Himself      Reason for referral: HIV Positive    (Medical, Dental, Mental Health, Co-payment assistance)    Demographics  . Client was instructed to bring in his photo ID, verification of address, proof of income (2 recent paystubs and SSDI award letter), medical insurance card, and western blot.   Legal name: [name]                             Preferred name: Henry Shannon         Race: African American      Date of Birth: 1975-08-21   Address: [address] [place], [place] [address]   Primary contact #: [phone]   Emergency contact name and #: Mollie Germany (boyfriend)/same phone number   Primary language spoken: English    Interpreter needed: yes/no   Arranged ________________________________ (company name and date)    Other special needs that need assistance with: Client does not require special needs.    Household income: unknown              per (Annual/Monthly/Weekly)   . If no income what is source of support: Client states he financially supports himself with his SSDI and work income.     Marland Kitchen People in household: Client resides at a shelter.        Marital status: Single    Employment status: Part Time at [employer] Name: Medicare Part A and B  . Policy Number/Identification Number: [id number]  . Group Number: No group number  Medical History-from doctor's office or hospital  Date of HIV diagnosis: 2005     Previous or current HIV care: Client previously received his care at [facility] in [place].        HIV medication(s): Client is currently not on HIV medication.        Hospitalized within the last 30 days: Client stated he was recently admitted at [facility] for outpatient surgery.      Have you been incarcerated in the last 12 months? (if yes, please schedule with Angelica): Client states he has not been incarcerated in the last 12 months.   Pregnant status   Yes/No? N/A.     Other pertinent health/ mental health diagnosis that you will like to share: Client states he does have "a lot of other health problems" but will go over them when he comes for his intake appointment.   Primary IJP Site  Site: [facility] Intake Appointment:  Eligibility Visit: June 26, 2016 at 1:30 PM with Essie Christine  Social Worker Visit: June 26, 2016 at 3:00 PM with Angelica Torres-Mantilla  Labs: June 26, 2016 at 4:00 PM with Vista Mink

## 2016-06-26 ENCOUNTER — Other Ambulatory Visit (INDEPENDENT_AMBULATORY_CARE_PROVIDER_SITE_OTHER): Payer: Medicare Other

## 2016-06-26 ENCOUNTER — Encounter (INDEPENDENT_AMBULATORY_CARE_PROVIDER_SITE_OTHER): Payer: Medicare Other

## 2016-07-01 IMAGING — CR DG ABDOMEN 1V
1 series · 1 of 1 positions shown · non-contrast
Comparison: None.

CLINICAL DATA: Abdominal pain since February 2015. Blood in
stool. Initial encounter.

EXAM:
ABDOMEN - 1 VIEW

[ap (kub)]
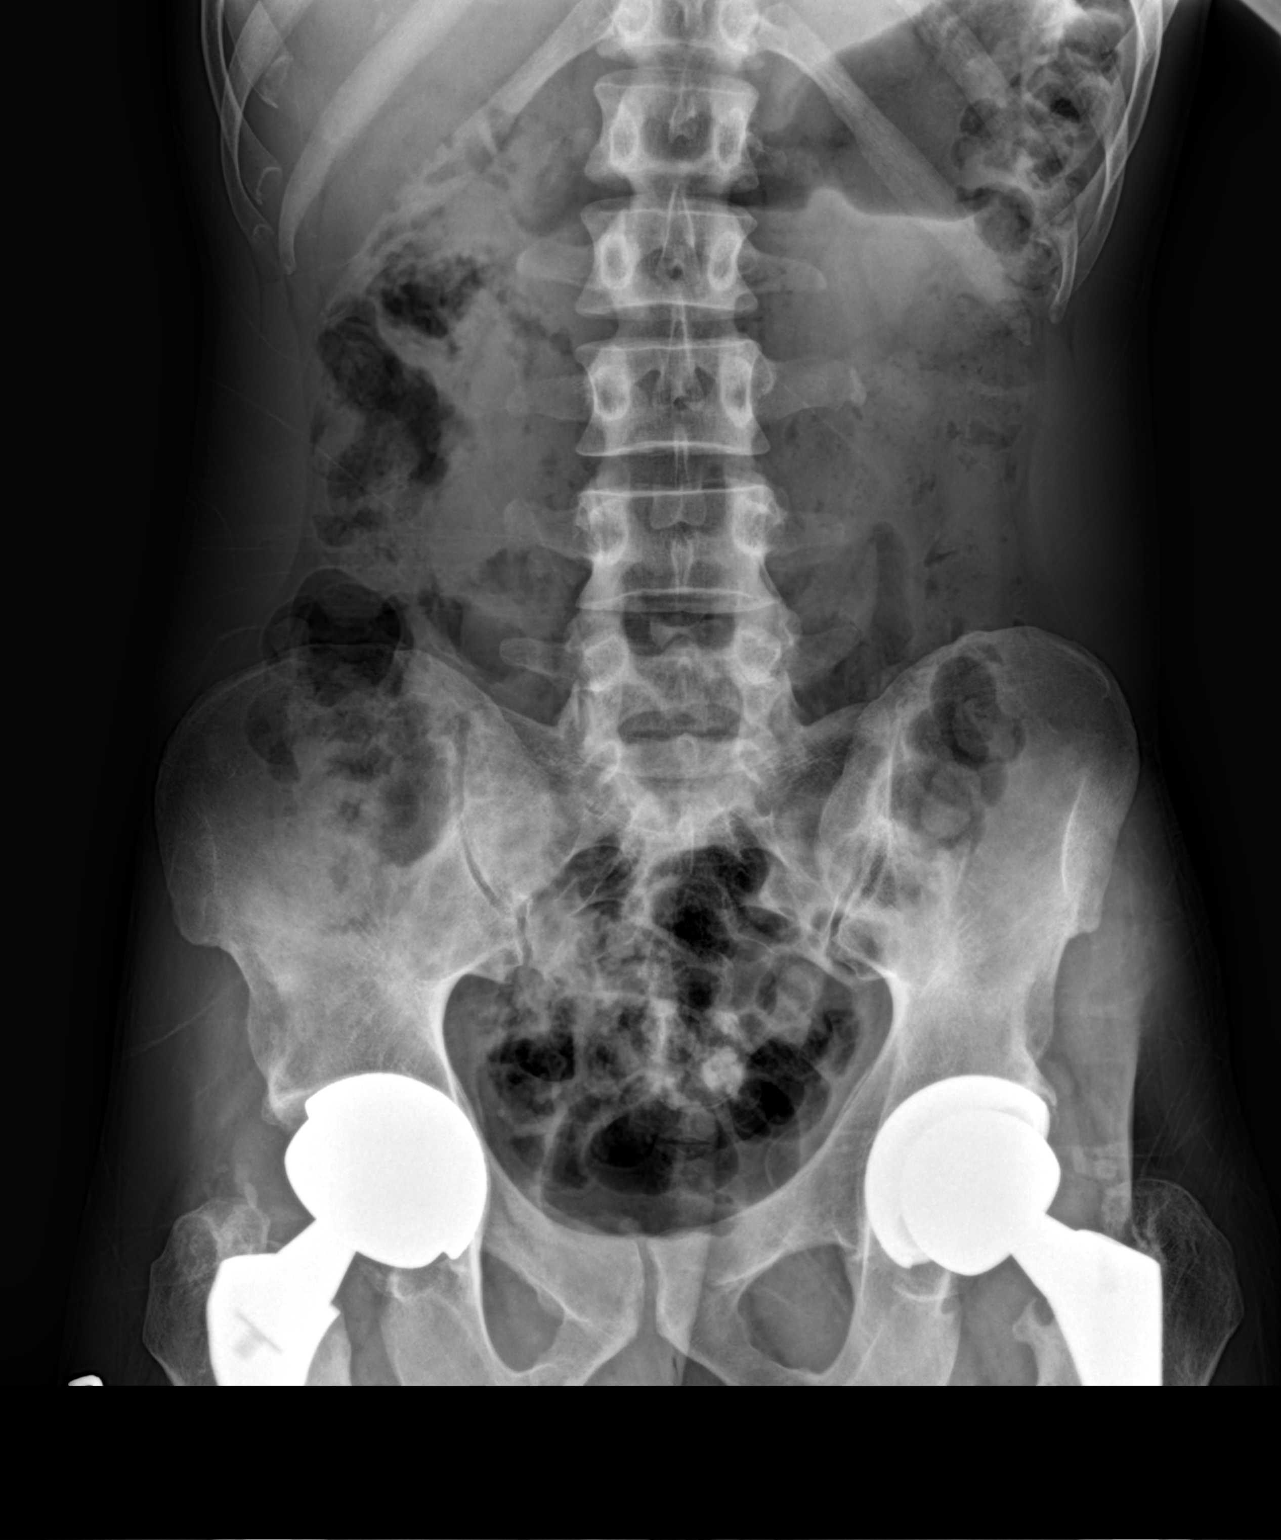

[1 of 1 positions shown; findings below may reference images not displayed]

FINDINGS: The bowel gas pattern is normal. No radio-opaque calculi or other
significant radiographic abnormality are seen. Bilateral hip
replacements are noted.
IMPRESSION: Negative exam.

## 2016-07-08 ENCOUNTER — Telehealth (INDEPENDENT_AMBULATORY_CARE_PROVIDER_SITE_OTHER): Payer: Self-pay

## 2016-07-08 NOTE — Telephone Encounter (Signed)
Client's partner called and scheduled his appointment for himself for July 10, 2016. Client's partner was informed that due to the short staff that day, we will be unable to see the client on the same day as well and needed to schedule a different date for the client. Client's partner verbalized understanding and we scheduled the following.     Following Appointment:  Eligibility Visit: July 17, 2016 at 1:00 PM with Essie Christine   Nurse Visit: July 17, 2016 at 2:00 PM with Vista Mink

## 2016-07-17 ENCOUNTER — Ambulatory Visit (INDEPENDENT_AMBULATORY_CARE_PROVIDER_SITE_OTHER): Payer: Medicare Other

## 2016-07-17 ENCOUNTER — Encounter (INDEPENDENT_AMBULATORY_CARE_PROVIDER_SITE_OTHER): Payer: Medicare Other

## 2016-07-21 DIAGNOSIS — M79621 Pain in right upper arm: Secondary | ICD-10-CM | POA: Diagnosis not present

## 2016-07-21 DIAGNOSIS — Z21 Asymptomatic human immunodeficiency virus [HIV] infection status: Secondary | ICD-10-CM | POA: Diagnosis not present

## 2016-07-21 DIAGNOSIS — L732 Hidradenitis suppurativa: Secondary | ICD-10-CM | POA: Diagnosis not present

## 2016-07-21 DIAGNOSIS — M7989 Other specified soft tissue disorders: Secondary | ICD-10-CM | POA: Diagnosis not present

## 2016-07-21 DIAGNOSIS — B356 Tinea cruris: Secondary | ICD-10-CM | POA: Diagnosis not present

## 2016-07-24 ENCOUNTER — Other Ambulatory Visit: Payer: Self-pay | Admitting: Infectious Diseases

## 2016-07-24 DIAGNOSIS — B20 Human immunodeficiency virus [HIV] disease: Secondary | ICD-10-CM

## 2016-08-11 DIAGNOSIS — R05 Cough: Secondary | ICD-10-CM | POA: Diagnosis not present

## 2016-08-11 DIAGNOSIS — J029 Acute pharyngitis, unspecified: Secondary | ICD-10-CM | POA: Diagnosis not present

## 2016-08-11 DIAGNOSIS — B20 Human immunodeficiency virus [HIV] disease: Secondary | ICD-10-CM | POA: Diagnosis not present

## 2016-08-11 DIAGNOSIS — J3489 Other specified disorders of nose and nasal sinuses: Secondary | ICD-10-CM | POA: Diagnosis not present

## 2016-10-13 DIAGNOSIS — H6121 Impacted cerumen, right ear: Secondary | ICD-10-CM | POA: Diagnosis not present

## 2016-10-13 DIAGNOSIS — B20 Human immunodeficiency virus [HIV] disease: Secondary | ICD-10-CM | POA: Diagnosis not present

## 2016-10-17 DIAGNOSIS — S50862A Insect bite (nonvenomous) of left forearm, initial encounter: Secondary | ICD-10-CM | POA: Diagnosis not present

## 2016-12-27 DIAGNOSIS — L739 Follicular disorder, unspecified: Secondary | ICD-10-CM | POA: Diagnosis not present

## 2016-12-27 DIAGNOSIS — R220 Localized swelling, mass and lump, head: Secondary | ICD-10-CM | POA: Diagnosis not present

## 2016-12-27 DIAGNOSIS — Z21 Asymptomatic human immunodeficiency virus [HIV] infection status: Secondary | ICD-10-CM | POA: Diagnosis not present

## 2016-12-27 DIAGNOSIS — K122 Cellulitis and abscess of mouth: Secondary | ICD-10-CM | POA: Diagnosis not present

## 2016-12-27 DIAGNOSIS — L0201 Cutaneous abscess of face: Secondary | ICD-10-CM | POA: Diagnosis not present

## 2017-01-01 ENCOUNTER — Other Ambulatory Visit: Payer: Self-pay | Admitting: Infectious Diseases

## 2017-01-01 DIAGNOSIS — B20 Human immunodeficiency virus [HIV] disease: Secondary | ICD-10-CM

## 2017-01-15 DIAGNOSIS — R109 Unspecified abdominal pain: Secondary | ICD-10-CM | POA: Diagnosis not present

## 2017-01-15 DIAGNOSIS — B20 Human immunodeficiency virus [HIV] disease: Secondary | ICD-10-CM | POA: Diagnosis not present

## 2017-01-21 ENCOUNTER — Ambulatory Visit: Payer: Medicare Other | Admitting: Infectious Diseases

## 2017-02-16 DIAGNOSIS — F17200 Nicotine dependence, unspecified, uncomplicated: Secondary | ICD-10-CM | POA: Diagnosis not present

## 2017-02-16 DIAGNOSIS — L02411 Cutaneous abscess of right axilla: Secondary | ICD-10-CM | POA: Diagnosis not present

## 2017-02-16 DIAGNOSIS — L02412 Cutaneous abscess of left axilla: Secondary | ICD-10-CM | POA: Diagnosis not present

## 2017-02-16 DIAGNOSIS — L0231 Cutaneous abscess of buttock: Secondary | ICD-10-CM | POA: Diagnosis not present

## 2017-02-16 DIAGNOSIS — Z23 Encounter for immunization: Secondary | ICD-10-CM | POA: Diagnosis not present

## 2017-02-16 DIAGNOSIS — Z21 Asymptomatic human immunodeficiency virus [HIV] infection status: Secondary | ICD-10-CM | POA: Diagnosis not present

## 2017-02-26 DIAGNOSIS — H6993 Unspecified Eustachian tube disorder, bilateral: Secondary | ICD-10-CM | POA: Diagnosis not present

## 2017-02-26 DIAGNOSIS — B20 Human immunodeficiency virus [HIV] disease: Secondary | ICD-10-CM | POA: Diagnosis not present

## 2017-02-26 DIAGNOSIS — I10 Essential (primary) hypertension: Secondary | ICD-10-CM | POA: Diagnosis not present

## 2017-02-26 DIAGNOSIS — D649 Anemia, unspecified: Secondary | ICD-10-CM | POA: Diagnosis not present

## 2017-02-26 DIAGNOSIS — J301 Allergic rhinitis due to pollen: Secondary | ICD-10-CM | POA: Diagnosis not present

## 2017-04-28 ENCOUNTER — Other Ambulatory Visit (INDEPENDENT_AMBULATORY_CARE_PROVIDER_SITE_OTHER): Payer: Self-pay | Admitting: Family

## 2017-04-28 ENCOUNTER — Ambulatory Visit (INDEPENDENT_AMBULATORY_CARE_PROVIDER_SITE_OTHER): Payer: Self-pay

## 2017-04-28 DIAGNOSIS — Z139 Encounter for screening, unspecified: Secondary | ICD-10-CM

## 2017-04-30 ENCOUNTER — Emergency Department
Admission: EM | Admit: 2017-04-30 | Discharge: 2017-04-30 | Disposition: A | Discharge status: Home / Self Care | Payer: Medicare Other | Attending: Emergency Medicine | Admitting: Emergency Medicine

## 2017-04-30 ENCOUNTER — Emergency Department: Payer: Medicare Other

## 2017-04-30 DIAGNOSIS — R197 Diarrhea, unspecified: Secondary | ICD-10-CM | POA: Insufficient documentation

## 2017-04-30 DIAGNOSIS — Z21 Asymptomatic human immunodeficiency virus [HIV] infection status: Secondary | ICD-10-CM | POA: Insufficient documentation

## 2017-04-30 DIAGNOSIS — L0291 Cutaneous abscess, unspecified: Secondary | ICD-10-CM

## 2017-04-30 DIAGNOSIS — M25511 Pain in right shoulder: Secondary | ICD-10-CM | POA: Insufficient documentation

## 2017-04-30 DIAGNOSIS — L02411 Cutaneous abscess of right axilla: Secondary | ICD-10-CM | POA: Insufficient documentation

## 2017-04-30 DIAGNOSIS — R1084 Generalized abdominal pain: Secondary | ICD-10-CM | POA: Insufficient documentation

## 2017-04-30 DIAGNOSIS — F17210 Nicotine dependence, cigarettes, uncomplicated: Secondary | ICD-10-CM | POA: Insufficient documentation

## 2017-04-30 DIAGNOSIS — W19XXXA Unspecified fall, initial encounter: Secondary | ICD-10-CM | POA: Insufficient documentation

## 2017-04-30 DIAGNOSIS — R109 Unspecified abdominal pain: Secondary | ICD-10-CM | POA: Diagnosis not present

## 2017-04-30 LAB — COMPREHENSIVE METABOLIC PANEL
ALT: 19 U/L (ref 0–55)
AST (SGOT): 23 U/L (ref 5–34)
Albumin/Globulin Ratio: 1 (ref 0.9–2.2)
Albumin: 3.5 g/dL (ref 3.5–5.0)
Alkaline Phosphatase: 50 U/L (ref 38–106)
Anion Gap: 8 (ref 5.0–15.0)
BUN: 8 mg/dL — ABNORMAL LOW (ref 9.0–28.0)
Bilirubin, Total: 0.3 mg/dL (ref 0.2–1.2)
CO2: 28 mEq/L (ref 22–29)
Calcium: 9 mg/dL (ref 8.5–10.5)
Chloride: 105 mEq/L (ref 100–111)
Creatinine: 0.9 mg/dL (ref 0.7–1.3)
Globulin: 3.6 g/dL (ref 2.0–3.6)
Glucose: 101 mg/dL — ABNORMAL HIGH (ref 70–100)
Potassium: 3.6 mEq/L (ref 3.5–5.1)
Protein, Total: 7.1 g/dL (ref 6.0–8.3)
Sodium: 141 mEq/L (ref 136–145)

## 2017-04-30 LAB — RAPID DRUG SCREEN, URINE
Barbiturate Screen, UR: NEGATIVE
Benzodiazepine Screen, UR: NEGATIVE
Cannabinoid Screen, UR: POSITIVE — AB
Cocaine, UR: NEGATIVE
Opiate Screen, UR: NEGATIVE
PCP Screen, UR: NEGATIVE
Urine Amphetamine Screen: NEGATIVE

## 2017-04-30 LAB — CBC AND DIFFERENTIAL
Absolute NRBC: 0 10*3/uL
Basophils Absolute Automated: 0.01 10*3/uL (ref 0.00–0.20)
Basophils Automated: 0.1 %
Eosinophils Absolute Automated: 0.88 10*3/uL — ABNORMAL HIGH (ref 0.00–0.70)
Eosinophils Automated: 12.1 %
Hematocrit: 38.3 % — ABNORMAL LOW (ref 42.0–52.0)
Hgb: 12.7 g/dL — ABNORMAL LOW (ref 13.0–17.0)
Immature Granulocytes Absolute: 0.01 10*3/uL
Immature Granulocytes: 0.1 %
Lymphocytes Absolute Automated: 1.69 10*3/uL (ref 0.50–4.40)
Lymphocytes Automated: 23.2 %
MCH: 31.5 pg (ref 28.0–32.0)
MCHC: 33.2 g/dL (ref 32.0–36.0)
MCV: 95 fL (ref 80.0–100.0)
MPV: 10.9 fL (ref 9.4–12.3)
Monocytes Absolute Automated: 0.69 10*3/uL (ref 0.00–1.20)
Monocytes: 9.5 %
Neutrophils Absolute: 4.02 10*3/uL (ref 1.80–8.10)
Neutrophils: 55 %
Nucleated RBC: 0 /100 WBC (ref 0.0–1.0)
Platelets: 194 10*3/uL (ref 140–400)
RBC: 4.03 10*6/uL — ABNORMAL LOW (ref 4.70–6.00)
RDW: 13 % (ref 12–15)
WBC: 7.3 10*3/uL (ref 3.50–10.80)

## 2017-04-30 LAB — URINALYSIS
Bilirubin, UA: NEGATIVE
Blood, UA: NEGATIVE
Glucose, UA: NEGATIVE
Ketones UA: NEGATIVE
Leukocyte Esterase, UA: NEGATIVE
Nitrite, UA: NEGATIVE
Protein, UR: NEGATIVE
Specific Gravity UA: 1.018 (ref 1.001–1.035)
Urine pH: 7 (ref 5.0–8.0)
Urobilinogen, UA: NORMAL mg/dL (ref 0.2–2.0)

## 2017-04-30 LAB — GFR: EGFR: >60

## 2017-04-30 MED ORDER — LIDOCAINE-TRANSPARENT DRESSING 4 % EX KIT
PACK | Freq: Once | CUTANEOUS | Status: AC
Start: 2017-04-30 — End: 2017-04-30

## 2017-04-30 MED ORDER — KETOROLAC TROMETHAMINE 30 MG/ML IJ SOLN
30.0000 mg | Freq: Once | INTRAMUSCULAR | Status: AC
Start: 2017-04-30 — End: 2017-04-30
  Administered 2017-04-30: 15:00:00 30 mg via INTRAVENOUS
  Filled 2017-04-30: qty 1

## 2017-04-30 MED ORDER — CEPHALEXIN 500 MG PO CAPS
500.0000 mg | ORAL_CAPSULE | Freq: Once | ORAL | Status: AC
Start: 2017-04-30 — End: 2017-04-30
  Administered 2017-04-30: 16:00:00 500 mg via ORAL
  Filled 2017-04-30: qty 1

## 2017-04-30 MED ORDER — SULFAMETHOXAZOLE-TRIMETHOPRIM 800-160 MG PO TABS
1.0000 | ORAL_TABLET | Freq: Two times a day (BID) | ORAL | 0 refills | Status: AC
Start: 2017-04-30 — End: 2017-05-07

## 2017-04-30 MED ORDER — LIDOCAINE HCL (PF) 1 % IJ SOLN
30.0000 mL | Freq: Once | INTRAMUSCULAR | Status: AC
Start: 2017-04-30 — End: 2017-04-30
  Administered 2017-04-30: 14:00:00 30 mL
  Filled 2017-04-30: qty 30

## 2017-04-30 MED ORDER — SULFAMETHOXAZOLE-TRIMETHOPRIM 800-160 MG PO TABS
1.0000 | ORAL_TABLET | Freq: Once | ORAL | Status: AC
Start: 2017-04-30 — End: 2017-04-30
  Administered 2017-04-30: 16:00:00 1 via ORAL
  Filled 2017-04-30: qty 1

## 2017-04-30 MED ORDER — CEPHALEXIN 500 MG PO CAPS
500.0000 mg | ORAL_CAPSULE | Freq: Four times a day (QID) | ORAL | 0 refills | Status: AC
Start: 2017-04-30 — End: 2017-05-07

## 2017-04-30 NOTE — ED Provider Notes (Signed)
Physician/Midlevel provider first contact with patient: 04/30/17 1335         History     Chief Complaint   Patient presents with   . Abdominal Pain   . Recurrent Skin Infections     [name] with history of HIV who has not been taking his medications for several months, presents ER complaining of abdominal pain associated with intermittent diarrhea, right armpit abscess and right shoulder pain.  Patient reports long history of the above except the right shoulder pain, which began after a fall several weeks ago.  Patient reports he recently moved to the area from [place] and was retrieving treatment for his abdominal pain, which he believed was diagnosed as gastritis.  Patient reports he gets recurrent abscesses to his right armpit and this episode began approximately one month ago.  He has been seen at [facility] for the same.  Patient rates the pain at 1 out of 10 with no radiation.  No nausea, vomiting, fever or chills.  Patient has not tried any over-the-counter medications.      The history is provided by the patient. No language interpreter was used.   Abdominal Pain   Pain location:  Generalized  Pain quality: cramping and sharp    Pain radiates to:  Does not radiate  Pain severity:  Mild  Onset quality:  Gradual  Timing:  Intermittent  Progression:  Worsening  Chronicity:  Chronic  Context: not trauma    Relieved by:  None tried  Worsened by:  Nothing  Ineffective treatments:  None tried  Associated symptoms: diarrhea    Associated symptoms: no chest pain, no chills, no constipation, no cough, no dysuria, no fatigue, no fever, no hematochezia, no hematuria, no nausea, no shortness of breath, no sore throat and no vomiting    Diarrhea:     Quality:  Watery    Severity:  Mild    Timing:  Sporadic    Progression:  Resolved  Risk factors comment:  HIV positive, not taking prescribed meds           Past Medical History:   Diagnosis Date   . HIV disease        Past Surgical History:   Procedure  Laterality Date   . HERNIA REPAIR     . TOTAL HIP ARTHROPLASTY Bilateral        No family history on file.   Family history does not contribute     Social-lives with others   Social History   Substance Use Topics   . Smoking status: Current Some Day Smoker     Types: Cigarettes   . Smokeless tobacco: Never Used   . Alcohol use No       .     Allergies   Allergen Reactions   . Clindamycin Hives       Home Medications     Med List Status:  In Progress Set By: Scot Jun, RN at 04/30/2017  1:46 PM                bismuth subsalicylate (PEPTO BISMOL) 262 MG/15ML suspension     Take 15 mLs by mouth.           Review of Systems   Constitutional: Negative for chills, fatigue and fever.   HENT: Negative for congestion, rhinorrhea and sore throat.    Eyes: Negative for discharge and redness.   Respiratory: Negative for cough and shortness of breath.  Cardiovascular: Negative for chest pain and palpitations.   Gastrointestinal: Positive for abdominal pain and diarrhea. Negative for constipation, hematochezia, nausea and vomiting.   Genitourinary: Negative for dysuria, flank pain, frequency, hematuria and urgency.   Musculoskeletal: Positive for arthralgias. Negative for back pain, gait problem, joint swelling, myalgias, neck pain and neck stiffness.   Skin: Negative for color change, pallor, rash and wound.   Neurological: Negative for dizziness, syncope, weakness, numbness and headaches.   Hematological: Does not bruise/bleed easily.   Psychiatric/Behavioral: Negative for self-injury. The patient is not nervous/anxious.        Physical Exam    BP: 128/70, Heart Rate: 81, Temp: 97.9 F (36.6 C), Resp Rate: 18, SpO2: 99 %, Weight: 62.6 kg    Physical Exam   Constitutional: He is oriented to person, place, and time. He appears well-developed and well-nourished. No distress.   Pt resting comfortably in NAD   HENT:   Head: Normocephalic and atraumatic.   Right Ear: External ear normal.   Left Ear: External ear normal.    Nose: Nose normal.   Mouth/Throat: Oropharynx is clear and moist. No oropharyngeal exudate.   Eyes: Pupils are equal, round, and reactive to light. Conjunctivae are normal. Right eye exhibits no discharge. Left eye exhibits no discharge. No scleral icterus.   Neck: Normal range of motion. Neck supple. No JVD present. No tracheal deviation present.   Cardiovascular: Normal rate and regular rhythm.    Pulmonary/Chest: Effort normal and breath sounds normal. No respiratory distress. He has no wheezes. He has no rales. He exhibits no tenderness.   Abdominal: Soft. Bowel sounds are normal. He exhibits no distension and no mass. There is no tenderness. There is no rebound and no guarding.   No ttp elicited      Musculoskeletal: Normal range of motion. He exhibits tenderness. He exhibits no edema.        Right shoulder: He exhibits tenderness, bony tenderness and pain. He exhibits normal range of motion, no swelling, no deformity, no laceration and no spasm.   Right shoulder ttp, pt with full rom, with pain, good pms        Neurological: He is alert and oriented to person, place, and time.   Skin: Skin is warm and dry. Lesion noted. No rash noted. He is not diaphoretic.   Right armpit with edema, erythema, induration and some fluctuance, no active drainage            Psychiatric: He has a normal mood and affect. His behavior is normal. Judgment and thought content normal.   Nursing note and vitals reviewed.        MDM and ED Course     ED Medication Orders     Start Ordered     Status Ordering Provider    04/30/17 1600 04/30/17 1549  sulfamethoxazole-trimethoprim (BACTRIM DS,SEPTRA DS) 800-160 MG per tablet 1 tablet  Once     Route: Oral  Ordered Dose: 1 tablet     Last MAR action:  Given CONCAUGH-GRUENDEL, Kentley Cedillo ELIZABETH    04/30/17 1600 04/30/17 1549  cephalexin (KEFLEX) capsule 500 mg  Once     Route: Oral  Ordered Dose: 500 mg     Last MAR action:  Given CONCAUGH-GRUENDEL, Cataldo Cosgriff ELIZABETH    04/30/17 1500  04/30/17 1456  ketorolac (TORADOL) injection 30 mg  Once     Route: Intravenous  Ordered Dose: 30 mg     Last MAR action:  Given CONCAUGH-GRUENDEL, Ahlana Slaydon ELIZABETH  04/30/17 1415 04/30/17 1414  lidocaine (PF) (XYLOCAINE) 1 % injection 30 mL  Once     Route: Other  Ordered Dose: 30 mL     Last MAR action:  Given CONCAUGH-GRUENDEL, Karysa Heft ELIZABETH    04/30/17 1400 04/30/17 1354  lidocaine (LMX 4 PLUS) 4 % dressing  Once     Route: Topical     Last MAR action:  Given CONCAUGH-GRUENDEL, Angala Hilgers ELIZABETH             MDM  Number of Diagnoses or Management Options  Abscess:   Acute pain of right shoulder:   Diarrhea, unspecified type:   Fall, initial encounter:   Generalized abdominal pain:   Diagnosis management comments: I, Langley Gauss, PA-C, have been the primary provider for Henry Shannon during this Emergency Dept visit. The attending signature signifies review and agreement of the history, physical exam, evaluation, clinical impression and plan except as noted.   I have reviewed the nursing notes, including Past medical and surgical,Family and Social History     Oxygen Saturation by Pulse Oximetry  is 95%-100% - normal, no interventions needed     DDX: [place] contusion, shoulder sprain, strain, shoulder pain.  Differential diagnosis abscess, cellulitis, MRSA, hidradenitis  differential diagnosis gastritis, gastroenteritis, abdominal pain, abdominal cramping, PUD    Labs reviewed by me -  urine with no evidence of infection, urine tox is positive for marijuana, complete blood count with H and H decreased 12.7/30.3 consistent with anemia, complete metabolic panel is within normal limits  Xray reviewed by me - no fracture seen     Multiple reassessments of the patient. Pt resting comfortably in NAD. Pt with relief after meds. Discussed with patient need for rest, avoid aggravating activities and follow-up. Return to the ER for any concerns. Pt voices understanding. No questions.     Previous  records reviewed by me          Amount and/or Complexity of Data Reviewed  Clinical lab tests: ordered and reviewed  Tests in the radiology section of CPT: ordered and reviewed  Decide to obtain previous medical records or to obtain history from someone other than the patient: yes  Review and summarize past medical records: yes  Discuss the patient with other providers: yes  Independent visualization of images, tracings, or specimens: yes    Risk of Complications, Morbidity, and/or Mortality  Presenting problems: moderate  Management options: moderate    Patient Progress  Patient progress: stable                   Procedures  Results     Procedure Component Value Units Date/Time    Urine Tox Screen (Rapid Drug Screen) [[id number]]  (Abnormal) Collected:  04/30/17 1445    Specimen:  Urine Updated:  04/30/17 1506     Amphetamine Screen, UR Negative     Barbiturate Screen, UR Negative     Benzodiazepine Screen, UR Negative     Cannabinoid Screen, UR Positive (A)     Cocaine, UR Negative     Opiate Screen, UR Negative     PCP Screen, UR Negative    UA with reflex to micro (pts  3 + yrs) [[id number]] Collected:  04/30/17 1445    Specimen:  Urine from Urine, Clean Catch Updated:  04/30/17 1502     Urine Type Clean Catch     Color, UA Yellow     Clarity, UA Clear     Specific Gravity UA 1.018  Urine pH 7.0     Leukocyte Esterase, UA Negative     Nitrite, UA Negative     Protein, UR Negative     Glucose, UA Negative     Ketones UA Negative     Urobilinogen, UA Normal mg/dL      Bilirubin, UA Negative     Blood, UA Negative    Comprehensive metabolic panel [[id number]]  (Abnormal) Collected:  04/30/17 1349    Specimen:  Blood Updated:  04/30/17 1424     Glucose 101 (H) mg/dL      BUN 8.0 (L) mg/dL      Creatinine 0.9 mg/dL      Sodium 409 mEq/L      Potassium 3.6 mEq/L      Chloride 105 mEq/L      CO2 28 mEq/L      Calcium 9.0 mg/dL      Protein, Total 7.1 g/dL      Albumin 3.5 g/dL      AST (SGOT) 23 U/L      ALT 19 U/L       Alkaline Phosphatase 50 U/L      Bilirubin, Total 0.3 mg/dL      Globulin 3.6 g/dL      Albumin/Globulin Ratio 1.0     Anion Gap 8.0    GFR [[id number]] Collected:  04/30/17 1349     Updated:  04/30/17 1424     EGFR >60.0    CBC with differential [[id number]]  (Abnormal) Collected:  04/30/17 1349    Specimen:  Blood from Blood Updated:  04/30/17 1401     WBC 7.30 x10 3/uL      Hgb 12.7 (L) g/dL      Hematocrit 57.8 (L) %      Platelets 194 x10 3/uL      RBC 4.03 (L) x10 6/uL      MCV 95.0 fL      MCH 31.5 pg      MCHC 33.2 g/dL      RDW 13 %      MPV 10.9 fL      Neutrophils 55.0 %      Lymphocytes Automated 23.2 %      Monocytes 9.5 %      Eosinophils Automated 12.1 %      Basophils Automated 0.1 %      Immature Granulocyte 0.1 %      Nucleated RBC 0.0 /100 WBC      Neutrophils Absolute 4.02 x10 3/uL      Abs Lymph Automated 1.69 x10 3/uL      Abs Mono Automated 0.69 x10 3/uL      Abs Eos Automated 0.88 (H) x10 3/uL      Absolute Baso Automated 0.01 x10 3/uL      Absolute Immature Granulocyte 0.01 x10 3/uL      Absolute NRBC 0.00 x10 3/uL           Radiology Results (24 Hour)     Procedure Component Value Units Date/Time    Shoulder Right 2+ Views [[id number]] Collected:  04/30/17 1508    Order Status:  Completed Updated:  04/30/17 1516    Narrative:       Clinical History: Patient fell 3 weeks ago. Right shoulder pain.     Findings: Internal/external rotation and scapular Y views of the right  shoulder.     Mild lucency along the distal right clavicle may be related to early  degenerative change. No fracture,  dislocation, or significant  glenohumeral joint osteoarthritis is identified.      Impression:        No evidence of fracture.    Darra Lis, MD   04/30/2017 3:12 PM          Clinical Impression & Disposition     Clinical Impression  Final diagnoses:   Acute pain of right shoulder   Fall, initial encounter   Generalized abdominal pain   Diarrhea, unspecified type   Abscess        ED Disposition      ED Disposition Condition Date/Time Comment    Discharge  Thu Apr 30, 2017  3:56 PM Henry Shannon discharge to home/self care.    Condition at disposition: Stable           Discharge Medication List as of 04/30/2017  3:57 PM                    Concaugh-Gruendel, Faylene Kurtz, PA  04/30/17 [phone]

## 2017-04-30 NOTE — Discharge Instructions (Signed)
[facility] . Winter 2018   [facility]   Medicine is personal again.   [facility] physicians are leading the way in health and wellness with a personalized approach to getting you healthy and keeping you well. Our expert clinicians take time to get to know you as a patient - and as a person. At each visit, we'll empower you with tools and information so you can make better and more informed health decisions.   Our focus is on health, not just healthcare, and we're making it easier for you to feel well inside and out with:   . Conveniently located practices   . Access to your electronic medical record   . Access to [facility] [place]'s network of hospitals and outpatient facilities   . Evaluate a new physician through star ratings at [email or web address]   Find the right doctor for you, call [email or web address]   Get to Know Our Physicians:   [email or web address]   [email or web address]    [email or web address]   [email or web address]    [address] [place], [place] [address]  *These providers see pediatric patients.  +Walk-in appointments welcome.  [place]  [address] [place], [place] [address]  Sherrlyn Hock, MD*   Julieta Gutting, [place]  [address], [address] [place], [place] [address]  Jerilynn Birkenhead, MD*  Garnett Farm, MD*  Jennye Moccasin, MD*  Clemetine Marker, MD*  Shade Flood, PA-C*  [facility]  [address] [place], [place] [address]  Mahlon Gammon, MD  Phebe Colla, MD  Katy Apo, [place]  [address] [place], [place] [address]  Kristen Cardinal, MD*  Veneta Penton, MD  Kathee Polite, MD*  Eliezer Lofts, MD  Hoy Morn, MD  +[facility] [place] [phone] [address], [place] [address]  [place] Sanjuana Letters, MD  Quintella Reichert, [place]  [address] [place], [place] [address]  John Giovanni, MD  Ruta Hinds, MD  Clearance Coots, MD  Marrian Salvage, MD  Glee Arvin, MD  Reed Pandy, MD  Dolores Hoose, MD  Rodney Cruise, MD  Marlana Salvage, MD*  Elease Etienne, NP  Alberteen Sam, NP  Clent Demark, NP  Armando Gang Silvestre Moment, DO  [facility] [place]  [address], [place] [address]  [facility] [place]  [address], [address] [place], [place] [address]  Sandria Senter, MD  Tempie Donning, MD  Loretta Plume, MD  Angelia Mould, MD  Hardin Negus, MD  Evert Kohl, MD  Roney Mans, MD  [facility] [place]  [address], [place], [facility] [place]  [address], [place], [place] [phone]  [place] Loletta Parish, MD  Lorri Frederick, MD  Cecily Dana Allan, MDSusan Linda Hedges, MD  Lise Auer, MD  Karrie Doffing, MD  L. Lorna Dibble, MDShelly (Xiaoxu) Talbert Cage, MD  Sandre Kitty, PA-C  Silvestre Gunner, PA-C  Arvella Merles, FNP  Lauren Naughton, PA-C  [place], PA-C  Dillard [place]. Denese Killings, MD  Alva Garnet, MD  Jan Fireman, MD  Normand Sloop  Gonzella Lex, MD  Clemencia Course, MD  Tracie Harrier, PA-C  [place], [place]  Lorane Gell, RD  [facility]  [address], [address] [place], [place] [address]  [facility] [place]  [address], [place], [place] [address]  [address] [place], [place] [address]  Otho Darner, MD  Hart Carwin. Candie Chroman, MD*  Gypsy Lore, DO*  Judith Blonder, MD*  Armanda Heritage, NP  Adron Bene, MD  Jarome Matin, MD  [address] [place], [place] [address]  Sandre Kitty, MD  Alfonso Ramus, NP  [facility]  [place].  [place], [place] [address]  Charlette Caffey,  MD*  Herbert Seta, MD  Roxana Hires, MD*  Elsie Amis, PA-C*  Mardella Layman, MD*  [facility]  [address], [address] [place], [place] [address]  Gerome Sam, MD*  Erasmo Score, NP  Dinah Beers, MD  Adella Nissen, DO*  +[place], [place], [place] [address]  Jordan Likes, MD  Claudia Desanctis, MD  Orlean Bradford, MD*  Daiva Nakayama, MD  Esmeralda Arthur, MD  Era Bumpers, MD  Rudene Christians, NP  Gardiner Rhyme, NP  [facility] June Lee, DO  Minh D. Earley Favor, MD  [place], [place] [address]   Rosalio Macadamia, Bracey Viola  [address] [place], [place] [address]  Rica Koyanagi, NP  Beckey Rutter, MD  Gillis Ends, MD  Da Cheri Guppy, MD*  Josefa Half, MD  [place]  [address] [place], [place] [address]  [facility]  [place], [place] [address]   RhodalineTootell, MD*  Tedra Coupe, MD*  [place] Patabandige "Deepani" [place] Brace, MD  Dionne Bucy, MD  Stevie Kern, MD  Roxy Cedar, MD  Minus Breeding, MD  NalinPatel, MD  Find the right doctor for you, call [email or web address]    [facility]  +[facility] - [address] [place], [place] [address]   *These providers see pediatric patients.  +Extended primary care hours.  [facility] [place]  [address] [place] [address] [place],  [address]  Titus Mould, MD  Theressa Stamps, MD  Rob Hickman, NP  Clearance Coots, MD  [place] [address] [place],  [address]  [place]  [address] [place],  [address]  Elisabeth Pigeon, MD*  Violet Baldy, MD*  Blane Ohara, MD*  Shelby Mattocks, MD*  Gordy Levan, MD*  Pearletha Forge, MD*  Si Raider, MD  Mervyn Skeeters, MD  Virtual CareAs a recipient of federal financial assistance, [facility]") does not exclude, deny benefits to, or otherwise discriminate against any person on the basis of race, color, national origin, sex, disability, or age in admission to, participation in, or receipt of the services or benefits under any of its programs or activities, whether carried out by [place] directly or through a contractor or any other entity with which [employer] arranges to carry out its programs and activities. Interpreter Services are available at no cost to you. Please let our staff  know of your needs for effective communication. Atencin: Si usted habla espaol, tiene a su disposicin servicios gratuitos de asistencia con el idioma. Por favor infrmele a nuestro personal sobre sus necesidades para lograr una comunicacin efectiva. ??????: ??? ???? ????? ?? ?? ?? ???? ?????. ???? ????? ?? ??? ?? ??? ?? ????? ????? ????.   Virtual online care that fits your schedule, anytime, anywhere.  [email or web address]  Gean Maidens  For a complete list of [facility] physicians, including adult specialists, visit [place] at [email or web address]   [facility]  Open 7 days a week.  [facility] - [address] [place], [place] [address]  [facility] - [address] [place], [place] [address]  [facility] - [address] [place], [place] [address]  [facility] - [place] [id number] [place], [place] [address]  [facility] - [place] [address], [address] [place], [place] [address]  [facility] - [address] [place], [place] [address]  [facility] - [address] [place], [place] [address]  [facility] - [address] [place], [place] [address]  [facility] - [address] [place], [place] [address]  [facility] - [address] [place], [place] [address]  Opening 2018  [facility] [place]  Walk in or reserve your spot at  [email or web address]  [id number]          Please rest and avoid aggravating activities.  Please follow-up.  Return here for any concerns.    Abdominal Pain    You have been diagnosed with abdominal (belly) pain. The cause of your pain is not yet known.    Many things can cause abdominal pain. Examples include viral infections and bowel (intestine) spasms. You might need another examination or more tests to find out why you have pain.    At this time, your pain does not seem to be caused by anything dangerous. You do not need surgery. You do not need to stay in the hospital.     Though we don't believe your condition is dangerous right now, it is important to be careful. Sometimes a problem that seems mild can become serious later. This is why it is very important that you return here or go to the nearest Emergency Department unless you are 100% improved.    Return here or go to the nearest Emergency Department, or follow up with your physician in:   12 hours.    Drink only clear liquids such as water, clear broth, sports drinks, or clear caffeine-free soft drinks, like 7-Up or Sprite, for the next:   12 hours.    YOU SHOULD SEEK MEDICAL ATTENTION IMMEDIATELY, EITHER HERE OR AT THE NEAREST EMERGENCY DEPARTMENT, IF ANY OF THE FOLLOWING OCCURS:   Your pain does not go away or gets worse.   You cannot keep fluids down or your vomit is dark green.    You vomit blood or see blood in your stool. Blood might be bright red or dark red. It can also be black and look like tar.   You have a fever (temperature higher than 100.50F / 38C) or shaking chills.   Your skin or eyes look yellow or your urine looks brown.   You have severe diarrhea.               Abscess    You were diagnosed with an abscess of the:   Skin.    An abscess is  a sore that is infected and filled with pus. It is caused by an infection with bacteria. Sometimes a splinter or other object stuck in the skin can cause an infection that can  become an abscess. The body traps the infection in a tight pocket to try to stop the infection from spreading to other areas. The usual treatment is to make a cut in the abscess so the pus can drain out. Most abscesses heal quickly with no need for antibiotics.    Your doctor has determined that antibiotics ARE necessary to treat your abscess. Fill the prescription and take all medications as prescribed until they are all gone.    A drain and/or packing have been placed in your abscess to help the wound heal. The packing in your wound will need to be changed. This can be done by your family doctor, a referral doctor, this facility or the nearest Emergency Department. Your doctor will set up a plan for you to have the packing changed. Leave the drain and packing in place until you see a doctor. The drain might fall out on its own. If this happens, cover the abscess with a clean dressing (bandage) and follow up with a doctor as scheduled. Until the packing is removed, don't soak the wound in water, like in a bathtub or pool. Short showers or sponge baths are okay. Keep the wound as dry and clean as possible.    YOU SHOULD SEEK MEDICAL ATTENTION IMMEDIATELY, EITHER HERE OR AT THE NEAREST EMERGENCY DEPARTMENT, IF ANY OF THE FOLLOWING OCCURS:   You see unusual redness or swelling.   You see red streaks on the arm or leg.   The wound or drainage smells bad.   You have fever (temperature higher than 100.33F / 38C), chills, worse pain or swelling.             Diarrhea    You have been diagnosed with diarrhea.    You have diarrhea when you have stools (bowel movements) that are soft or liquid, or when you have too many stools in a day. You may also have stomach cramps/ pains, nausea (feeling sick to your stomach), vomiting and fever (temperature higher than 100.33F / 38C).    Diarrhea can be caused by bacteria, viruses and parasites. People sometimes get "traveler's diarrhea." They get it when they go to other  countries. It is caused by bacteria.    People with diarrhea often lose a lot of body fluids. This causes dehydration. Drink a lot of water or other fluids to stay hydrated. You may also be given medicine for your diarrhea. It will slow the diarrhea and stop the vomiting (if you have this symptom).    You should drink lots of natural juices or a sports-type drink that has electrolytes (sodium, potassium, etc). Do not drink a lot of plain water. Sugary drinks like apple and pear juice might make the diarrhea worse.    You might be given medicine to help you with your diarrhea, nausea (feeling sick), vomiting and stomach cramps. This will depend on your age, medical history and symptoms.    It is OK for you to go home today.    Good hygiene (keeping clean) helps to keep the problem from spreading. Please wash your hands often, using soap and water, especially after using the bathroom. Do not prepare food or share food, drinks or utensils (forks, knives, etc.) with other people.    It is very important that you follow  very important that you follow up with your regular doctor or a specialist. The doctor will tell you how soon this follow-up needs to be.    YOU SHOULD SEEK MEDICAL ATTENTION IMMEDIATELY, EITHER HERE OR AT THE NEAREST EMERGENCY DEPARTMENT, IF ANY OF THE FOLLOWING OCCURS:   You are vomiting and cannot keep down fluids.   There is blood, pus or mucous in your stool.   You have symptoms of dehydration. These include dry mouth, not urinating (peeing) at least once every eight hours, feeling dizzy/lightheaded, severe weakness or passing out.

## 2017-04-30 NOTE — EDIE (Signed)
Kaylon Laroche?NOTIFICATION?04/30/2017 13:34?TAELON, BENDORF K?MRN: [id number]    This patient has registered at the [facility] Emergency Room: [facility] Emergency Department   For more information visit: [email or web address]   Criteria met      5 ED Visits in 12 Months    Security Events  No recent Security Events currently on file    ED Care Guidelines  There are currently no ED Care Guidelines in [place] for this patient. Please check your facility's medical records system.    Care Providers  Rushil Kimbrell has no care providers on record at this time.   E.D. Visit Count (12 mo.)  Facility Visits   [facility]: [place] 1   [facility] 8   Total 9   Note: Visits indicate total known visits.      Recent Emergency Department Visit Summary  Admit Date Facility [facility] Type Major Type Diagnoses or Chief Complaint   Apr 30, 2017 [facility]: [place] [facility]  Emergency      Abdominal pain, diarrhea, shoulder pain      Feb 16, 2017 [facility] [place] [place] [place] [facility]  Emergency         Recent Inpatient Visit Summary  No recorded inpatient visits.         The above information is provided for the sole purpose of patient treatment. Use of this information beyond the terms of Data Sharing Memorandum of Understanding and License Agreement is prohibited. In certain cases not all visits may be represented. Consult the aforementioned facilities for additional information.   ? 2018 [employer] - [place], [place] - [email or web address]

## 2017-04-30 NOTE — ED Provider Notes (Signed)
ATTENDING : Helayne Seminole, MD. I HAVE PERSONALLY SEEN AND EXAMINED PATIENT. I have personally obtained a history, performed a focused PE, and participated in the medical decision making of this patient.     I have discussed and agree with the care plan of the Advanced Care Provider.    Brief Note -  r axilla abscess x months and also abd pain chronically x months in a noncompliant HIV med patient. Mild tenderness lower abdomen. I and D/basic blood work and op f/u with [employer].         Helayne Seminole, MD  04/30/17 [phone]

## 2017-05-06 ENCOUNTER — Emergency Department
Admission: EM | Admit: 2017-05-06 | Discharge: 2017-05-06 | Disposition: A | Discharge status: Home / Self Care | Payer: Medicare Other | Attending: Emergency Medicine | Admitting: Emergency Medicine

## 2017-05-06 DIAGNOSIS — Z4800 Encounter for change or removal of nonsurgical wound dressing: Secondary | ICD-10-CM | POA: Insufficient documentation

## 2017-05-06 DIAGNOSIS — Z5189 Encounter for other specified aftercare: Secondary | ICD-10-CM

## 2017-05-06 NOTE — ED Provider Notes (Signed)
Physician/Midlevel provider first contact with patient: 05/06/17 1530         History     Chief Complaint   Patient presents with   . Wound Check       Chief Complaint:  Wound recheck  Location:  Right axilla  Onset:  November 15  Character:  Tender  Aggravating/Alleviating Factors:  Tender to palpation.  Associated wound packing.  No aggravating or alleviating factors.  Symptoms treatment with incision and drainage, wound packing, antibiotics  Timing:  Constant  Environment:  Home  Severity:  Mild  Context:  Patient, alone to ED, for evaluation of right axillary wound check.  Patient had incision and drainage of right axillary abscess on November 15.  A cotton packing was placed.  He returns for packing removal.  No complaints.    PMD  none      The history is provided by the patient and medical records. No language interpreter was used.            Past Medical History:   Diagnosis Date   . HIV disease        Past Surgical History:   Procedure Laterality Date   . HERNIA REPAIR     . TOTAL HIP ARTHROPLASTY Bilateral        History reviewed. No pertinent family history.    Social  Social History   Substance Use Topics   . Smoking status: Current Some Day Smoker     Types: Cigarettes   . Smokeless tobacco: Never Used   . Alcohol use No       .     Allergies   Allergen Reactions   . Clindamycin Hives       Home Medications     Med List Status:  In Progress Set By: Andee Poles, RN at 05/06/2017  3:25 PM                bismuth subsalicylate (PEPTO BISMOL) 262 MG/15ML suspension     Take 15 mLs by mouth.     cephalexin (KEFLEX) 500 MG capsule     Take 1 capsule (500 mg total) by mouth 4 (four) times daily.for 7 days     sulfamethoxazole-trimethoprim (BACTRIM DS) 800-160 MG per tablet     Take 1 tablet by mouth 2 (two) times daily.for 7 days           Review of Systems   Constitutional: Negative for activity change, chills, diaphoresis and fever.   Eyes: Negative for pain, discharge and redness.   Respiratory: Negative  for cough, chest tightness, shortness of breath and wheezing.    Cardiovascular: Negative for chest pain and palpitations.   Gastrointestinal: Negative for abdominal pain, diarrhea, nausea and vomiting.   Endocrine: Negative for polydipsia, polyphagia and polyuria.   Musculoskeletal: Negative for arthralgias, back pain, myalgias and neck pain.   Skin: Positive for wound. Negative for color change and rash.   Allergic/Immunologic: Negative for environmental allergies, food allergies and immunocompromised state.   Neurological: Negative for dizziness, tremors, syncope, weakness, light-headedness, numbness and headaches.   Hematological: Negative for adenopathy. Does not bruise/bleed easily.   Psychiatric/Behavioral: Negative for agitation and confusion. The patient is not nervous/anxious and is not hyperactive.    All other systems reviewed and are negative.      Physical Exam    BP: 134/82, Temp: 97.8 F (36.6 C), Resp Rate: 15, SpO2: 100 %, Weight: 62.2 kg    Physical Exam   Constitutional:  He is oriented to person, place, and time. He appears well-developed and well-nourished. He is cooperative.  Non-toxic appearance. He does not appear ill. No distress.   HENT:   Head: Normocephalic and atraumatic.   Nose: No mucosal edema or rhinorrhea. Right sinus exhibits no maxillary sinus tenderness and no frontal sinus tenderness. Left sinus exhibits no maxillary sinus tenderness and no frontal sinus tenderness.   Mouth/Throat: No posterior oropharyngeal edema, posterior oropharyngeal erythema or tonsillar abscesses.   Eyes: Conjunctivae and lids are normal. Right conjunctiva is not injected. Left conjunctiva is not injected. No scleral icterus.   Neck: Normal range of motion. Neck supple.   Cardiovascular: Normal rate, regular rhythm and intact distal pulses.    Pulses:       Radial pulses are 2+ on the right side.   Pulmonary/Chest: Effort normal. No respiratory distress.   Abdominal: He exhibits no abdominal bruit, no  ascites and no pulsatile midline mass. There is no hepatosplenomegaly. There is no rigidity and no CVA tenderness.   Musculoskeletal: Normal range of motion. He exhibits no edema, tenderness or deformity.   Neurological: He is alert and oriented to person, place, and time. No sensory deficit. He exhibits normal muscle tone. Coordination and gait normal.   Skin: Skin is warm and dry. Capillary refill takes less than 2 seconds. No abrasion, no ecchymosis, no laceration, no lesion, no petechiae and no rash noted. He is not diaphoretic. No erythema. Nails show no clubbing.        Psychiatric: He has a normal mood and affect. His speech is normal and behavior is normal. Judgment and thought content normal. Cognition and memory are normal.   Nursing note and vitals reviewed.        MDM and ED Course     ED Medication Orders     None             MDM  Number of Diagnoses or Management Options  Wound check, abscess: new and requires workup  Diagnosis management comments: Plan, packing removal, [place] reassessment.  No cx obtained.    1545  packing removed without difficulty.  Dressing was applied.  Patient's questions are answered.  He reports associated follow-up instructions.  Advised to Return if he is worse, has new concerns, signs or symptoms.  Patient may be discharged    The attending signature signifies review and agreement of the history , PE, evaluation, clinical impression and discharge plan except as otherwise noted.    I,Lucyle Alumbaugh, PA-C, have been the primary provider for Henry Shannon during this Emergency Dept visit.    Oxygen saturation by pulse oximetry is 95%-100%, Normal.  Interventions: None Needed.    DDX to include, but not limited to:  Wound recheck, cyst vs abscess, MRSA  Plan:  Supportive care, pmd/ID referral.        " *This note was generated by the Epic EMR system/ Dragon speech recognition and   may contain inherent errors or omissions not intended by the user. Grammatical    errors,  random word insertions, deletions, pronoun errors and incomplete   sentences are occasional consequences of this technology due to software   limitations. Not all errors are caught or corrected. If there are questions or    concerns about the content of this note or information contained within the body   of this dictation they should be addressed directly with the author for   Clarification."       Amount and/or  Complexity of Data Reviewed  Review and summarize past medical records: yes  Discuss the patient with other providers: yes (ED case d/w attending, potluri, j, who agrees with the current course, tx, and disposition plan.)    Risk of Complications, Morbidity, and/or Mortality  Presenting problems: high  Diagnostic procedures: high  Management options: high    Patient Progress  Patient progress: stable                   Procedures    Clinical Impression & Disposition     Clinical Impression  Final diagnoses:   Wound check, abscess        ED Disposition     ED Disposition Condition Date/Time Comment    Discharge  Wed May 06, 2017  4:26 PM Henry Shannon discharge to home/self care.    Condition at disposition: Stable           Discharge Medication List as of 05/06/2017  4:25 PM                    Leeasia Secrist, Selinda Flavin, PA  05/06/17 2043

## 2017-05-06 NOTE — Discharge Instructions (Signed)
Wound Recheck    You have been seen today for a recheck of your wound.    Your wound appears to be doing well. There are no signs of infection or worsening of the wound.    Continue with your wound care. No further exams are necessary.    YOU SHOULD SEEK MEDICAL ATTENTION IMMEDIATELY, EITHER HERE OR AT THE NEAREST EMERGENCY DEPARTMENT, IF ANY OF THE FOLLOWING OCCURS:   Your wound looks red, you see pus, or you have pain, swelling, or fever (temperature higher than 100.4F / 38C). These are signs of infection.   You notice red streaks spreading from the site of the wound.   Your wound seems worse in any way.    Rest, fluids.  Tylenol or ibuprofen for pain, fever.    Continue home wound care  Activity as tolerated.

## 2017-05-06 NOTE — EDIE (Signed)
Kindel Rochefort?NOTIFICATION?05/06/2017 15:17?KEELIN, SHERIDAN K?MRN: [id number]    This patient has registered at the [facility] Emergency Room: [facility] Emergency Department   For more information visit: [email or web address]   Criteria met      5 ED Visits in 12 Months    Security Events  No recent Security Events currently on file    ED Care Guidelines  There are currently no ED Care Guidelines in [place] for this patient. Please check your facility's medical records system.    Care Providers  Jaki Steptoe has no care providers on record at this time.   E.D. Visit Count (12 mo.)  Facility Visits   [facility]: [place] 2   [facility] 8   Total 10   Note: Visits indicate total known visits.      Recent Emergency Department Visit Summary  Admit Date Facility [facility] Type Major Type Diagnoses or Chief Complaint   May 06, 2017 [facility] Emergency Room: [place] [place] [facility]  Emergency      Wound Check      Apr 30, 2017 [facility] Emergency Room: [place]. [place] [facility]  Emergency      Abdominal pain, diarrhea, shoulder pain      Abdominal pain, diarrhea, shoulder pain, boils in armpit      Recurrent Skin Infections      Abdominal Pain      Cutaneous abscess, unspecified      Unspecified fall, initial encounter      Generalized abdominal pain      Pain in right shoulder      Diarrhea, unspecified      Feb 16, 2017 [facility]  Emergency  Emergency         Recent Inpatient Visit Summary  No recorded inpatient visits.         The above information is provided for the sole purpose of patient treatment. Use of this information beyond the terms of Data Sharing Memorandum of Understanding and License Agreement is prohibited. In certain cases not all visits may be represented. Consult the aforementioned facilities for additional information.   ? 2018 [employer] - [place], [place] - [email or web address]

## 2017-05-13 ENCOUNTER — Encounter (INDEPENDENT_AMBULATORY_CARE_PROVIDER_SITE_OTHER): Payer: Self-pay | Admitting: Internal Medicine

## 2017-05-13 ENCOUNTER — Ambulatory Visit (INDEPENDENT_AMBULATORY_CARE_PROVIDER_SITE_OTHER): Payer: Medicare Other | Admitting: Internal Medicine

## 2017-05-13 VITALS — BP 114/71 | HR 102 | Temp 98.2°F | Ht 67.25 in | Wt 133.2 lb

## 2017-05-13 DIAGNOSIS — R197 Diarrhea, unspecified: Secondary | ICD-10-CM

## 2017-05-13 DIAGNOSIS — B20 Human immunodeficiency virus [HIV] disease: Secondary | ICD-10-CM

## 2017-05-13 DIAGNOSIS — L0292 Furuncle, unspecified: Secondary | ICD-10-CM

## 2017-05-13 DIAGNOSIS — R1084 Generalized abdominal pain: Secondary | ICD-10-CM

## 2017-05-13 NOTE — Patient Instructions (Addendum)
Start Bactrim DS twice a day    Low fat diet and plenty of fluids orally    Advil 400 mg or Tylenol 500 mg as needed for pain    Need to see Infectious disease specialist soon    Prilosec 20 mg daily

## 2017-05-13 NOTE — Progress Notes (Signed)
Subjective:       Patient ID: [place] is a 41 y.o. male.  Chief Complaint   Patient presents with   . boils      not fasting   . Diarrhea   . Abdominal Pain   . HIV Positive/AIDS     HPI:         41 yrs old male came in to establish care. He has H/O-HIV and has had cutaneous abscesses in the past. He had right axillary boil recently which he got treated in the ED where it was drained. He now feels better and the abscess is much smaller. He denies any pain of fever. He has not taken his antibiotics as prescribed. No fever or chills.  Diarrhea    This is a new problem. The current episode started 1 to 4 weeks ago. The problem occurs 2 to 4 times per day. The problem has been unchanged. The stool consistency is described as watery. The patient states that diarrhea does not awaken him from sleep. Associated symptoms include abdominal pain and bloating. Pertinent negatives include no chills, fever, myalgias, vomiting or weight loss. Nothing aggravates the symptoms. There are no known risk factors. He has tried nothing for the symptoms.   Abdominal Pain   This is a new problem. The current episode started 1 to 4 weeks ago. The onset quality is undetermined. The problem occurs daily. The problem has been unchanged. The pain is located in the generalized abdominal region. The pain is at a severity of 5/10. The pain is moderate. The quality of the pain is cramping. The abdominal pain does not radiate. Associated symptoms include diarrhea and nausea. Pertinent negatives include no anorexia, dysuria, fever, hematuria, melena, myalgias, vomiting or weight loss. Nothing aggravates the pain. The pain is relieved by nothing. He has tried nothing for the symptoms.   HIV:         He has H/O- HIV for along time and was on medication in the past but has not taken his medication for last several months. He denies anorexia, weight loss but does get recurrent skin infections, abdominal pain and diarrhea. He had been under the  care of Infectious disease specialist but had no insurance for a while and hence has not seen anyone for a long time. His last HIV viral load was high ( > 33,000)    Past Medical History:   Diagnosis Date   . HIV disease    . PTSD (post-traumatic stress disorder)        Past Surgical History:   Procedure Laterality Date   . DRAINAGE, ABDOMINAL PELVIC ABSCESS  2006   . HERNIA REPAIR     . TOTAL HIP ARTHROPLASTY Bilateral        Family History   Problem Relation Age of Onset   . Arthritis Mother    . Depression Mother    . COPD Mother    . Depression Father    . Schizophrenia Father    . Arthritis Maternal Grandmother        Social History     Social History   . Marital status: Single     Spouse name: N/A   . Number of children: N/A   . Years of education: N/A     Occupational History   . Not on file.     Social History Main Topics   . Smoking status: Current Some Day Smoker     Types: Cigarettes   .  Smokeless tobacco: Never Used   . Alcohol use No   . Drug use: No   . Sexual activity: Not on file     Other Topics Concern   . Not on file     Social History Narrative   . No narrative on file       Allergies   Allergen Reactions   . Clindamycin Hives       Current Outpatient Prescriptions   Medication Sig Dispense Refill   . sulfamethoxazole-trimethoprim (BACTRIM DS,SEPTRA DS) 800-160 MG per tablet Take 1 tablet by mouth 2 (two) times daily.     . Atazanavir-Cobicistat (EVOTAZ) 300-150 MG Tab Take by mouth.     . Emtricitabine-Tenofovir AF (DESCOVY) 200-25 MG Tab Take by mouth.     . Ibuprofen (MIDOL) 200 MG Cap Take by mouth.       No current facility-administered medications for this visit.          Review of Systems   Constitutional: Negative for chills, fever and weight loss.   Respiratory: Negative for shortness of breath.    Gastrointestinal: Positive for abdominal pain, bloating, diarrhea and nausea. Negative for abdominal distention, anorexia, melena and vomiting.   Genitourinary: Negative for difficulty  urinating, dysuria and hematuria.   Musculoskeletal: Negative for myalgias.   Neurological: Negative for dizziness and light-headedness.     Vitals:    05/13/17 1652   BP: 114/71   Pulse: (!) 102   Temp: 98.2 F (36.8 C)   Weight: 60.4 kg (133 lb 3.2 oz)   Height: 1.708 m (5' 7.25")             Objective:    Physical Exam   Constitutional: He appears well-developed and well-nourished. No distress.   Neck: Normal range of motion. Neck supple.   Cardiovascular: Normal rate, regular rhythm and normal heart sounds.  Exam reveals no gallop.    No murmur heard.  Pulmonary/Chest: Effort normal and breath sounds normal. He has no wheezes. He has no rales.   Abdominal: Soft. Bowel sounds are normal. He exhibits no distension and no mass. There is no tenderness. There is no guarding.   Lymphadenopathy:     He has no cervical adenopathy.   Skin: Skin is warm and dry.   small boil in right axilla with induration - no pointing or fluctuation noted.   Vitals reviewed.          Assessment:       1. Boils  2. Abdominal Pain - generalized.  3. Diarrhea  - ? Infectious.  4. HIV  - uncontrolled.      Plan:       1. Start bactrim DS 1 po BID x 7 days.  2. Advil 400 mg or Tylenol 500 mg as needed for pain Q 8 hrs.  3. Light diet, no fried or fatty foods and no meat.  4. Plenty of fluids orally.  5. Labs: CBC, CMP, UA - sent.  6. Stool for WBC., Culture, Cryptosporidium, Cyclospora, Isospora and C diff -ordered. Will bring sample in.  7. Advised to see ID Specialist soon. Has appointment soon                                Procedures

## 2017-05-14 ENCOUNTER — Telehealth (INDEPENDENT_AMBULATORY_CARE_PROVIDER_SITE_OTHER): Payer: Self-pay | Admitting: Internal Medicine

## 2017-05-14 LAB — URINALYSIS
Bilirubin, UA: NEGATIVE
Blood, UA: NEGATIVE
Glucose, UA: NEGATIVE
Leukocyte Esterase, UA: NEGATIVE
Nitrite, UA: NEGATIVE
Protein, UR: NEGATIVE
Specific Gravity UA: 1.024 (ref 1.001–1.035)
Urine pH: 6.5 (ref 5.0–8.0)
Urobilinogen, UA: 2 — AB

## 2017-05-14 LAB — GFR: EGFR: >60

## 2017-05-14 LAB — COMPREHENSIVE METABOLIC PANEL
ALT: 20 U/L (ref 0–55)
AST (SGOT): 24 U/L (ref 5–34)
Albumin/Globulin Ratio: 1 (ref 0.9–2.2)
Albumin: 3.2 g/dL — ABNORMAL LOW (ref 3.5–5.0)
Alkaline Phosphatase: 46 U/L (ref 38–106)
BUN: 12 mg/dL (ref 9.0–28.0)
Bilirubin, Total: 0.6 mg/dL (ref 0.1–1.2)
CO2: 27 mEq/L (ref 21–29)
Calcium: 8.4 mg/dL — ABNORMAL LOW (ref 8.5–10.5)
Chloride: 107 mEq/L (ref 100–111)
Creatinine: 0.9 mg/dL (ref 0.5–1.5)
Globulin: 3.1 g/dL (ref 2.0–3.7)
Glucose: 96 mg/dL (ref 70–100)
Potassium: 3.6 mEq/L (ref 3.5–5.1)
Protein, Total: 6.3 g/dL (ref 6.0–8.3)
Sodium: 141 mEq/L (ref 136–145)

## 2017-05-14 LAB — CBC AND DIFFERENTIAL
Absolute NRBC: 0 10*3/uL
Basophils Absolute Automated: 0.01 10*3/uL (ref 0.00–0.20)
Basophils Automated: 0.3 %
Eosinophils Absolute Automated: 0.27 10*3/uL (ref 0.00–0.70)
Eosinophils Automated: 7.8 %
Hematocrit: 39.6 % — ABNORMAL LOW (ref 42.0–52.0)
Hgb: 12.9 g/dL — ABNORMAL LOW (ref 13.0–17.0)
Immature Granulocytes Absolute: 0.01 10*3/uL
Immature Granulocytes: 0.3 %
Lymphocytes Absolute Automated: 1.42 10*3/uL (ref 0.50–4.40)
Lymphocytes Automated: 40.9 %
MCH: 30.6 pg (ref 28.0–32.0)
MCHC: 32.6 g/dL (ref 32.0–36.0)
MCV: 93.8 fL (ref 80.0–100.0)
MPV: 11.9 fL (ref 9.4–12.3)
Monocytes Absolute Automated: 0.57 10*3/uL (ref 0.00–1.20)
Monocytes: 16.4 %
Neutrophils Absolute: 1.19 10*3/uL — ABNORMAL LOW (ref 1.80–8.10)
Neutrophils: 34.3 %
Nucleated RBC: 0 /100 WBC (ref 0.0–1.0)
Platelets: 179 10*3/uL (ref 140–400)
RBC: 4.22 10*6/uL — ABNORMAL LOW (ref 4.70–6.00)
RDW: 14 % (ref 12–15)
WBC: 3.47 10*3/uL — ABNORMAL LOW (ref 3.50–10.80)

## 2017-05-14 LAB — HEMOLYSIS INDEX: Hemolysis Index: 5 (ref 0–18)

## 2017-05-14 NOTE — Telephone Encounter (Signed)
Called patient made aware of lab results. Patient stated that will bring specimen (stool)

## 2017-05-14 NOTE — Telephone Encounter (Signed)
-----   Message from Jerilee Field, MD sent at 05/14/2017  8:20 AM EST -----  WBC count is low and Hemoglobin is borderline low.  Stool test report awaited.    Let patient know

## 2017-05-22 DIAGNOSIS — K219 Gastro-esophageal reflux disease without esophagitis: Secondary | ICD-10-CM | POA: Diagnosis not present

## 2017-05-22 DIAGNOSIS — Z0389 Encounter for observation for other suspected diseases and conditions ruled out: Secondary | ICD-10-CM | POA: Diagnosis not present

## 2017-05-22 DIAGNOSIS — B20 Human immunodeficiency virus [HIV] disease: Secondary | ICD-10-CM | POA: Diagnosis not present

## 2017-05-22 DIAGNOSIS — R51 Headache: Secondary | ICD-10-CM | POA: Diagnosis not present

## 2017-05-22 DIAGNOSIS — F419 Anxiety disorder, unspecified: Secondary | ICD-10-CM | POA: Diagnosis not present

## 2017-05-29 ENCOUNTER — Other Ambulatory Visit: Payer: Medicare Other

## 2017-06-04 ENCOUNTER — Telehealth (INDEPENDENT_AMBULATORY_CARE_PROVIDER_SITE_OTHER): Payer: Self-pay | Admitting: Internal Medicine

## 2017-06-04 NOTE — Telephone Encounter (Signed)
Patient is calling to speak with Dr. Brent Bulla or nurse to go over lab results for blood work and stool sample    [phone]

## 2017-06-05 DIAGNOSIS — F419 Anxiety disorder, unspecified: Secondary | ICD-10-CM | POA: Diagnosis not present

## 2017-06-05 DIAGNOSIS — R51 Headache: Secondary | ICD-10-CM | POA: Diagnosis not present

## 2017-06-05 DIAGNOSIS — B20 Human immunodeficiency virus [HIV] disease: Secondary | ICD-10-CM | POA: Diagnosis not present

## 2017-06-05 DIAGNOSIS — K219 Gastro-esophageal reflux disease without esophagitis: Secondary | ICD-10-CM | POA: Diagnosis not present

## 2017-06-05 NOTE — Telephone Encounter (Signed)
Patient called back but the phone got disconnected. If you can please call the patient back.[place]

## 2017-06-05 NOTE — Telephone Encounter (Signed)
Notified patient of needing to come into office to obtain another stool container. Patient stated will try to come in today or after the holidays. Lab technician aware.

## 2017-06-05 NOTE — Telephone Encounter (Signed)
Left voicemail for patient to call back for stool sample patient left is insufficient amount so will need to come to get another stool kit to be resubmitted. Attempted to contact patient but number was not in service at the time.

## 2017-07-10 DIAGNOSIS — B20 Human immunodeficiency virus [HIV] disease: Secondary | ICD-10-CM | POA: Diagnosis not present

## 2017-07-17 DIAGNOSIS — B20 Human immunodeficiency virus [HIV] disease: Secondary | ICD-10-CM | POA: Diagnosis not present

## 2017-07-17 DIAGNOSIS — R51 Headache: Secondary | ICD-10-CM | POA: Diagnosis not present

## 2017-07-17 DIAGNOSIS — K219 Gastro-esophageal reflux disease without esophagitis: Secondary | ICD-10-CM | POA: Diagnosis not present

## 2017-07-17 DIAGNOSIS — F419 Anxiety disorder, unspecified: Secondary | ICD-10-CM | POA: Diagnosis not present

## 2017-07-17 DIAGNOSIS — Z23 Encounter for immunization: Secondary | ICD-10-CM | POA: Diagnosis not present

## 2017-07-24 DIAGNOSIS — M25511 Pain in right shoulder: Secondary | ICD-10-CM | POA: Diagnosis not present

## 2017-07-24 DIAGNOSIS — S4991XA Unspecified injury of right shoulder and upper arm, initial encounter: Secondary | ICD-10-CM | POA: Diagnosis not present

## 2017-07-24 DIAGNOSIS — M7581 Other shoulder lesions, right shoulder: Secondary | ICD-10-CM | POA: Diagnosis not present

## 2019-06-01 DIAGNOSIS — R1084 Generalized abdominal pain: Secondary | ICD-10-CM

## 2019-06-02 ENCOUNTER — Inpatient Hospital Stay: Admit: 2019-06-02 | Discharge: 2019-06-02 | Payer: MEDICAID | Attending: Emergency Medicine

## 2019-06-02 NOTE — ED Provider Notes (Signed)
HPI   Patient left prior to being seen by ER attendant    No past medical history on file.    No past surgical history on file.      No family history on file.    Social History     Socioeconomic History   ??? Marital status: SINGLE     Spouse name: Not on file   ??? Number of children: Not on file   ??? Years of education: Not on file   ??? Highest education level: Not on file   Occupational History   ??? Not on file   Social Needs   ??? Financial resource strain: Not on file   ??? Food insecurity     Worry: Not on file     Inability: Not on file   ??? Transportation needs     Medical: Not on file     Non-medical: Not on file   Tobacco Use   ??? Smoking status: Not on file   Substance and Sexual Activity   ??? Alcohol use: Not on file   ??? Drug use: Not on file   ??? Sexual activity: Not on file   Lifestyle   ??? Physical activity     Days per week: Not on file     Minutes per session: Not on file   ??? Stress: Not on file   Relationships   ??? Social Product manager on phone: Not on file     Gets together: Not on file     Attends religious service: Not on file     Active member of club or organization: Not on file     Attends meetings of clubs or organizations: Not on file     Relationship status: Not on file   ??? Intimate partner violence     Fear of current or ex partner: Not on file     Emotionally abused: Not on file     Physically abused: Not on file     Forced sexual activity: Not on file   Other Topics Concern   ??? Not on file   Social History Narrative   ??? Not on file         ALLERGIES: Patient has no allergy information on record.    Review of Systems    There were no vitals filed for this visit.         Physical Exam     MDM       Procedures

## 2019-06-02 NOTE — ED Provider Notes (Signed)
HPI   Patient left prior to being seen by ER attendant    No past medical history on file.    No past surgical history on file.      No family history on file.    Social History     Socioeconomic History   ??? Marital status: SINGLE     Spouse name: Not on file   ??? Number of children: Not on file   ??? Years of education: Not on file   ??? Highest education level: Not on file   Occupational History   ??? Not on file   Social Needs   ??? Financial resource strain: Not on file   ??? Food insecurity     Worry: Not on file     Inability: Not on file   ??? Transportation needs     Medical: Not on file     Non-medical: Not on file   Tobacco Use   ??? Smoking status: Not on file   Substance and Sexual Activity   ??? Alcohol use: Not on file   ??? Drug use: Not on file   ??? Sexual activity: Not on file   Lifestyle   ??? Physical activity     Days per week: Not on file     Minutes per session: Not on file   ??? Stress: Not on file   Relationships   ??? Social connections     Talks on phone: Not on file     Gets together: Not on file     Attends religious service: Not on file     Active member of club or organization: Not on file     Attends meetings of clubs or organizations: Not on file     Relationship status: Not on file   ??? Intimate partner violence     Fear of current or ex partner: Not on file     Emotionally abused: Not on file     Physically abused: Not on file     Forced sexual activity: Not on file   Other Topics Concern   ??? Not on file   Social History Narrative   ??? Not on file         ALLERGIES: Patient has no allergy information on record.    Review of Systems    There were no vitals filed for this visit.         Physical Exam     MDM       Procedures

## 2020-04-03 ENCOUNTER — Inpatient Hospital Stay
Admit: 2020-04-03 | Discharge: 2020-04-03 | Disposition: A | Discharge status: Home / Self Care | Payer: MEDICARE | Attending: Emergency Medicine

## 2020-04-03 DIAGNOSIS — K0889 Other specified disorders of teeth and supporting structures: Secondary | ICD-10-CM

## 2020-04-03 MED ORDER — LIDOCAINE 2 % MUCOSAL SOLN
2 % | 0 refills | Status: DC | PRN
Start: 2020-04-03 — End: 2020-05-05

## 2020-04-03 MED ORDER — NAPROXEN 500 MG TAB
500 mg | ORAL_TABLET | Freq: Two times a day (BID) | ORAL | 0 refills | Status: AC | PRN
Start: 2020-04-03 — End: 2020-04-13

## 2020-04-03 MED ORDER — AMOXICILLIN CLAVULANATE 875 MG-125 MG TAB
875-125 mg | ORAL_TABLET | Freq: Two times a day (BID) | ORAL | 0 refills | Status: AC
Start: 2020-04-03 — End: 2020-04-10

## 2020-04-03 NOTE — ED Provider Notes (Signed)
ED Provider Notes by Jacqulynn Cadet, FNP at 04/03/20 1745                Author: Jacqulynn Cadet, FNP  Service: --  Author Type: Nurse Practitioner       Filed: 04/03/20 1752  Date of Service: 04/03/20 1745  Status: Attested           Editor: Jacqulynn Cadet, FNP (Nurse Practitioner)  Cosigner: Gerrit Halls, MD at 04/10/20 1538          Attestation signed by Gerrit Halls, MD at 04/10/20 1538          I was personally available for consultation in the emergency department.  I have reviewed the chart and agree with the documented record by the APP, including  the assessment, treatment plan, and disposition.  Barbaraann Share, MD.                                 EMERGENCY DEPARTMENT HISTORY AND PHYSICAL EXAM      5:45 PM         Date: 04/03/2020   Patient Name: Sean Graham        History of Presenting Illness          Chief Complaint       Patient presents with        ?  Dental Pain              History Provided By: Patient      Additional History (Context): Harlis Champoux  is a 44 y.o. male with past medical  history significant for HIV-undetectable viral load and hypertension who presents with complaints of right upper second molar pain for the last week with gradual worsening.  Thinks his filling came out while he was chewing.  He does have an appointment  with a dentist in 2 days for extraction.  He denies any facial swelling, fever, ear pain, headache.  He has tried BC powder, NyQuil p.m., and Tylenol without improvement.  It is disrupting his sleep due to pain.      PCP: None              Past History        Past Medical History:     Past Medical History:        Diagnosis  Date         ?  HIV (human immunodeficiency virus infection) (HCC)             Past Surgical History:     Past Surgical History:         Procedure  Laterality  Date          ?  HX HIP REPLACEMENT  Bilateral             Family History:   History reviewed. No pertinent family history.      Social History:     Social History           Tobacco Use         ?  Smoking status:  Current Every Day Smoker     ?  Smokeless tobacco:  Never Used       Substance Use Topics         ?  Alcohol use:  Never     ?  Drug use:  Yes  Types:  Marijuana           Allergies:     Allergies        Allergen  Reactions         ?  Clindamycin  Hives                Review of Systems           Review of Systems    Constitutional: Negative.  Negative for chills and fever.    HENT: Positive for dental problem. Negative for congestion, ear pain and rhinorrhea.     Eyes: Negative.  Negative for pain and redness.    Respiratory: Negative.  Negative for cough and shortness of breath.     Cardiovascular: Negative.  Negative for chest pain, palpitations and leg swelling.    Gastrointestinal: Negative.  Negative for abdominal pain, constipation, diarrhea, nausea and vomiting.    Genitourinary: Negative.  Negative for dysuria, frequency, hematuria and urgency.    Musculoskeletal: Negative.  Negative for back pain, gait problem, joint swelling and neck pain.    Skin: Negative.  Negative for rash and wound.    Neurological: Negative.  Negative for dizziness, seizures, speech difficulty, weakness, light-headedness and headaches.    Hematological: Negative for adenopathy. Does not bruise/bleed easily.    Psychiatric/Behavioral: Positive for sleep disturbance.    All other systems reviewed and are negative.              Physical Exam        Visit Vitals      Pulse  75     Temp  97.6 ??F (36.4 ??C)     Resp  16     Ht  5\' 7"  (1.702 m)     Wt  59 kg (130 lb)     SpO2  100%        BMI  20.36 kg/m??              Physical Exam   Vitals and nursing note reviewed.   Constitutional:        General: He is not in acute distress.     Appearance: Normal appearance.    HENT:       Head: Normocephalic and atraumatic.      Jaw: There is normal jaw occlusion. No trismus.      Salivary Glands: Right salivary gland is not diffusely enlarged or tender. Left salivary gland is not diffusely enlarged  or tender.      Right  Ear: Tympanic membrane, ear canal and external ear normal.      Left Ear: Tympanic membrane, ear canal and external ear normal.      Nose: Nose normal.      Mouth/Throat:      Mouth: Mucous membranes are moist.      Dentition:  Abnormal dentition. Does not have dentures. Dental tenderness  (#2 with missing filling.  Tooth is cracked.) and dental caries present. No gingival  swelling, dental abscesses or gum lesions.      Pharynx: Oropharynx is clear.    Eyes:       Conjunctiva/sclera: Conjunctivae normal.      Pupils: Pupils are equal, round, and reactive to light.   Cardiovascular:       Rate and Rhythm: Normal rate and regular rhythm.   Pulmonary :       Effort: Pulmonary effort is normal.      Breath sounds: Normal breath sounds.   Musculoskeletal :  Cervical back: Normal range of motion and neck supple. No edema, erythema or rigidity. No muscular tenderness.     Lymphadenopathy:       Cervical: No cervical adenopathy.   Skin :      General: Skin is warm and dry.      Capillary Refill: Capillary refill takes less than 2 seconds.    Neurological:       General: No focal deficit present.      Mental Status: He is alert and oriented to person, place, and time.    Psychiatric:         Mood and Affect: Mood normal.         Behavior: Behavior normal.                  Diagnostic Study Results        Labs -   No results found for this or any previous visit (from the past 12 hour(s)).      Radiologic Studies -      No orders to display                Medical Decision Making     I am the first provider for this patient.      I reviewed available nursing notes, past medical history, past surgical history, family history and social history.      Vital Signs-Reviewed the patient's vital signs.      Records Reviewed: Nursing Notes and Old Medical Records (Time of Review: 5:45 PM)      Pulse Oximetry Analysis - 100% on RA- normal       ED Course: Progress Notes, Reevaluation, and Consults:   5:45 PM   Initial assessment performed. The patients presenting problems have been discussed, and they/their family are in agreement with the care plan formulated and outlined with them. I have encouraged  them to ask questions as they arise throughout their visit.      Provider Notes (Medical Decision Making):       Differential diagnosis includes: Periapical abscess, pulpitis, odontogenic infection, sinusitis, trauma, alveolar osteitis, necrotizing gingivitis, retropharyngeal abscess, peritonsillar abscess .       Patient presents ambulatory, no acute distress.  Patient is well-hydrated and nontoxic in appearance.  Exam reveals missing filling and cracked tooth #2.  There is appropriate tenderness on palpation.   No localized induration or fluctuance to suggest drainable abscess. No sublingual/submandibular induration, trismus, or stridor.  Floor of mouth is soft.  Nasolabial folds are soft with no facial swelling.  Normal speech.  Handling oral secretions  without difficulty.  Patient has full range of motion of the neck. Low suspicion for Ludwig's angina or deep space infection.  Will discharge home with antibiotics and medication for pain.  Patient is advised to follow-up with a dentist/oral surgeon  to further manage this condition.                           Diagnosis        Clinical Impression:       1.  Dentalgia            Disposition: Discharged home in stable condition      DISCHARGE NOTE:       Patient has been reexamined. Patient has no new complaints, changes, or physical findings.  Care plan outlined and precautions discussed.  Results of physical exam findings were reviewed with the patient. All medications  were reviewed with the patient;  will discharge home with Augmentin, viscous lidocaine, naproxen. All of patient's questions and concerns were addressed. Patient was instructed and agrees to follow up with dentist/oral surgery as scheduled in 2 days, as well as to return to the ED upon  further  deterioration. Patient is ready to go home.        Follow-up Information               Follow up With  Specialties  Details  Why  Contact Info              [facility]    Schedule an appointment as soon as possible for a visit   Follow-up from the Emergency Department  [address]   [place] [address]   [phone]              [facility] EMERGENCY DEPT  Emergency Medicine    As needed, If symptoms worsen  [address] [place] [address]   [phone]                   Current Discharge Medication List              START taking these medications          Details        amoxicillin-clavulanate (Augmentin) 875-125 mg per tablet  Take 1 Tablet by mouth two (2) times a day for 7 days.   Qty: 14 Tablet, Refills:  0   Start date: 04/03/2020, End date:  04/10/2020               naproxen (Naprosyn) 500 mg tablet  Take 1 Tablet by mouth two (2) times daily as needed for Pain for up to 10 days.   Qty: 20 Tablet, Refills:  0   Start date: 04/03/2020, End date:  04/13/2020               lidocaine (Lidocaine Viscous) 2 % solution  Take 15 mL by mouth as needed for Pain. Indications: dental pain   Qty: 100 mL, Refills:  0   Start date: 04/03/2020                            Dictation disclaimer:  Please note that this dictation was completed with Dragon, the computer voice recognition software.  Quite often unanticipated grammatical, syntax, homophones,  and other interpretive errors are inadvertently transcribed by the computer software.  Please disregard these errors.  Please excuse any errors that have escaped final proofreading.

## 2020-04-03 NOTE — ED Notes (Signed)
C/o dental pain to right side of mouth x one week.  States losing filling out of right upper tooth.

## 2020-05-05 ENCOUNTER — Inpatient Hospital Stay
Admit: 2020-05-05 | Discharge: 2020-05-05 | Disposition: A | Discharge status: Home / Self Care | Payer: MEDICARE | Attending: Emergency Medicine

## 2020-05-05 DIAGNOSIS — K0889 Other specified disorders of teeth and supporting structures: Secondary | ICD-10-CM

## 2020-05-05 MED ORDER — OXYCODONE-ACETAMINOPHEN 5 MG-325 MG TAB
5-325 mg | ORAL_TABLET | Freq: Four times a day (QID) | ORAL | 0 refills | Status: AC | PRN
Start: 2020-05-05 — End: 2020-05-08

## 2020-05-05 MED ORDER — AMOXICILLIN CLAVULANATE 875 MG-125 MG TAB
875-125 mg | ORAL_TABLET | Freq: Two times a day (BID) | ORAL | 0 refills | Status: AC
Start: 2020-05-05 — End: 2020-05-15

## 2020-05-05 NOTE — ED Notes (Signed)
Patient discharged. Patient was given discharge instructions.  Patient verbalized understanding of discharge instructions.

## 2020-05-05 NOTE — ED Notes (Signed)
Pt c/o RIGHT SIDED DENTAL PAIN    Pt states he needs abx    States he has a dental appointment in December

## 2020-05-05 NOTE — ED Provider Notes (Signed)
ED Provider Notes by Malachy Mood, MD at 05/05/20 1114                Author: Malachy Mood, MD  Service: Emergency Medicine  Author Type: Physician       Filed: 05/05/20 2255  Date of Service: 05/05/20 1114  Status: Signed          Editor: Malachy Mood, MD (Physician)               EMERGENCY DEPARTMENT HISTORY AND PHYSICAL EXAM      11:14 AM   Date: 05/05/2020   Patient Name: Sean Graham        History of Presenting Illness           History Provided By:       HPI: Sean Graham is a  44 y.o. male with medical history as below presents with right upper dental pain that started  few days ago.  Patient has a dentist appointment in the next few weeks.  No difficulty swallowing.  No difficulty opening mouth no fever chills             PCP: None        Past History        Past Medical History:     Past Medical History:        Diagnosis  Date         ?  HIV (human immunodeficiency virus infection) (HCC)             Past Surgical History:     Past Surgical History:         Procedure  Laterality  Date          ?  HX HIP REPLACEMENT  Bilateral             Family History:   History reviewed. No pertinent family history.      Social History:     Social History          Tobacco Use         ?  Smoking status:  Current Every Day Smoker     ?  Smokeless tobacco:  Never Used       Substance Use Topics         ?  Alcohol use:  Never     ?  Drug use:  Yes              Types:  Marijuana           Allergies:     Allergies        Allergen  Reactions         ?  Clindamycin  Hives             Review of Systems     Review of Systems    Constitutional: Negative for activity change, appetite change and chills.    HENT: Negative for congestion, ear discharge, ear pain and sore throat.     Eyes: Negative for photophobia and pain.    Respiratory: Negative for cough and choking.     Cardiovascular: Negative for palpitations and leg swelling.    Gastrointestinal: Negative for anal bleeding and rectal pain.    Endocrine: Negative for  polydipsia and polyuria.    Genitourinary: Negative for genital sores and urgency.    Musculoskeletal: Negative for arthralgias and myalgias.    Neurological: Negative for dizziness, seizures and speech difficulty.    Psychiatric/Behavioral: Negative for hallucinations,  self-injury and suicidal ideas.             Physical Exam        Patient Vitals for the past 12 hrs:            Temp  Pulse  Resp  BP  SpO2            05/05/20 1033  98.6 ??F (37 ??C)  63  19  128/62  100 %           Physical Exam   Vitals and nursing note reviewed.   Constitutional:        Appearance: He is well-developed.   HENT :       Head: Normocephalic and atraumatic.      Mouth/Throat:      Dentition: Abnormal dentition.  Has dentures. Dental tenderness,  gingival swelling, dental caries,  dental abscesses and gum lesions present.       Eyes :       General:         Right eye: No discharge.         Left eye: No discharge.   Cardiovascular:       Rate and Rhythm: Normal rate and regular rhythm.      Heart sounds: Normal heart sounds. No murmur heard.        Pulmonary:       Effort: Pulmonary effort is normal. No respiratory distress.      Breath sounds: Normal breath sounds. No stridor. No wheezing or  rales.   Chest :       Chest wall: No tenderness.   Abdominal :      General: Bowel sounds are normal. There is no distension.      Palpations: Abdomen is soft.      Tenderness: There is no abdominal tenderness. There is no guarding or rebound.     Musculoskeletal:          General: Normal range of motion.      Cervical back: Normal range of motion and neck supple.    Skin:      General: Skin is warm and dry.   Neurological :       Mental Status: He is alert.              Diagnostic Study Results        Labs -   No results found for this or any previous visit (from the past 12 hour(s)).      Radiologic Studies -    No results found.           Medical Decision Making        ED Course: Progress Notes, Reevaluation, and Consults:      11:14 AM Initial  assessment performed. The patients presenting problems have been discussed, and they/their family are in agreement with the care plan formulated and outlined with them.  I have encouraged them to ask questions as they arise throughout  their visit.         Provider Notes (Medical Decision Making):    Patient presents with R upper dental pain    Dental carries   No trismus, no concern for Ludwig's angina, floor of the mouth soft   Patient will be given antibiotics prophylactically, pain medication   And follow-up with dentist               Vital Signs-Reviewed the patient's vital signs. Reviewed pt's pulse ox reading.  Records Reviewed: old medical records   -I am the first provider for this patient.   -I reviewed the vital signs, available nursing notes, past medical history, past surgical history, family history and social history.        Current Outpatient Medications          Medication  Sig  Dispense  Refill           ?  Biktarvy tab tablet                    Clinical Impression        Clinical Impression: No diagnosis found.      Disposition:               This note was dictated utilizing voice recognition software which may lead to typographical errors.  I apologize in advance if the situation occurs.  If questions arise please do not hesitate to contact me or call our department.      Malachy Mood, MD   11:14 AM

## 2020-11-30 ENCOUNTER — Emergency Department: Admit: 2020-11-30 | Payer: MEDICAID

## 2020-11-30 ENCOUNTER — Inpatient Hospital Stay
Admit: 2020-11-30 | Discharge: 2020-11-30 | Disposition: A | Discharge status: Home / Self Care | Payer: MEDICAID | Attending: Emergency Medicine

## 2020-11-30 DIAGNOSIS — S60222A Contusion of left hand, initial encounter: Secondary | ICD-10-CM

## 2020-11-30 MED ORDER — HYDROCODONE-ACETAMINOPHEN 5 MG-325 MG TAB
5-325 mg | ORAL | Status: AC
Start: 2020-11-30 — End: 2020-11-30
  Administered 2020-11-30: 23:00:00 via ORAL

## 2020-11-30 MED FILL — HYDROCODONE-ACETAMINOPHEN 5 MG-325 MG TAB: 5-325 mg | ORAL | Qty: 1

## 2020-11-30 NOTE — ED Notes (Signed)
Pt states he fell out of the shower yesterday & used his hands to brace his fall. Pt states his right hip gave out. Pt c/o of bilateral hand pain.

## 2020-11-30 NOTE — ED Notes (Signed)
Pt amb with steady gait to xray with tech

## 2020-11-30 NOTE — ED Notes (Signed)
Discharge instructions reviewed with patient and understanding verbalized. Patient exits through waiting area.

## 2020-11-30 NOTE — ED Provider Notes (Signed)
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  Patient present with primary complaint of sudden onset of moderate, sharp, nonradiating pain to his bilateral third MCP joints that occurred when patient slipped and fell as he was attempting to get out of the bath.  Patient reports no other injuries associated with this incident, no neck pain, back pain, chest pain, abdominal pain, and patient states he did not strike his head.  Patient also reports he has had a few months of pain to his right foot that he states is consistent with a retained foreign body from an injury years ago.  Patient reports no specific injury or wound to that area.           Past Medical History:   Diagnosis Date   ??? HIV (human immunodeficiency virus infection) (HCC)        Past Surgical History:   Procedure Laterality Date   ??? HX HIP REPLACEMENT Bilateral          History reviewed. No pertinent family history.    Social History     Socioeconomic History   ??? Marital status: SINGLE     Spouse name: Not on file   ??? Number of children: Not on file   ??? Years of education: Not on file   ??? Highest education level: Not on file   Occupational History   ??? Not on file   Tobacco Use   ??? Smoking status: Current Every Day Smoker   ??? Smokeless tobacco: Never Used   ??? Tobacco comment: black & mild   Substance and Sexual Activity   ??? Alcohol use: Yes   ??? Drug use: Yes     Types: Marijuana   ??? Sexual activity: Not on file   Other Topics Concern   ??? Not on file   Social History Narrative   ??? Not on file     Social Determinants of Health     Financial Resource Strain:    ??? Difficulty of Paying Living Expenses: Not on file   Food Insecurity:    ??? Worried About Running Out of Food in the Last Year: Not on file   ??? Ran Out of Food in the Last Year: Not on file   Transportation Needs:    ??? Lack of Transportation (Medical): Not on file   ??? Lack of  Transportation (Non-Medical): Not on file   Physical Activity:    ??? Days of Exercise per Week: Not on file   ??? Minutes of Exercise per Session: Not on file   Stress:    ??? Feeling of Stress : Not on file   Social Connections:    ??? Frequency of Communication with Friends and Family: Not on file   ??? Frequency of Social Gatherings with Friends and Family: Not on file   ??? Attends Religious Services: Not on file   ??? Active Member of Clubs or Organizations: Not on file   ??? Attends Banker Meetings: Not on file   ??? Marital Status: Not on file   Intimate Partner Violence:    ??? Fear of Current or Ex-Partner: Not on file   ??? Emotionally Abused: Not on file   ??? Physically Abused: Not on file   ??? Sexually Abused: Not on file   Housing Stability:    ??? Unable to Pay for Housing in the Last Year: Not on file   ???  Number of Places Lived in the Last Year: Not on file   ??? Unstable Housing in the Last Year: Not on file         ALLERGIES: Clindamycin    Review of Systems   Constitutional: Negative for chills and fever.   HENT: Negative for rhinorrhea and sore throat.    Eyes: Negative for discharge and redness.   Respiratory: Negative for cough and shortness of breath.    Cardiovascular: Negative for chest pain and leg swelling.   Gastrointestinal: Negative for abdominal pain, diarrhea, nausea and vomiting.   Genitourinary: Negative for difficulty urinating and dysuria.   Musculoskeletal: Positive for arthralgias. Negative for back pain and neck pain.   Skin: Negative for rash and wound.   Neurological: Negative for syncope, light-headedness and headaches.       Vitals:    11/30/20 1756   BP: 130/82   Pulse: 89   Resp: 19   Temp: 98.2 ??F (36.8 ??C)   SpO2: 100%   Weight: 64.9 kg (143 lb)   Height: 5\' 8"  (1.727 m)            Physical Exam  Constitutional:       General: He is not in acute distress.     Appearance: He is not ill-appearing, toxic-appearing or diaphoretic.   HENT:      Head: Normocephalic and atraumatic.       Right Ear: External ear normal.      Left Ear: External ear normal.      Nose: No congestion or rhinorrhea.      Mouth/Throat:      Mouth: Mucous membranes are moist.      Pharynx: No oropharyngeal exudate or posterior oropharyngeal erythema.   Eyes:      General:         Right eye: No discharge.         Left eye: No discharge.      Pupils: Pupils are equal, round, and reactive to light.   Neck:      Vascular: No carotid bruit.   Cardiovascular:      Rate and Rhythm: Normal rate and regular rhythm.      Heart sounds: No murmur heard.  No friction rub. No gallop.    Pulmonary:      Effort: Pulmonary effort is normal. No respiratory distress.      Breath sounds: No stridor. No wheezing, rhonchi or rales.   Abdominal:      General: Abdomen is flat. There is no distension.      Tenderness: There is no right CVA tenderness, left CVA tenderness, guarding or rebound.   Musculoskeletal:         General: Tenderness present. No swelling, deformity or signs of injury.      Cervical back: No rigidity or tenderness.      Right lower leg: No edema.      Left lower leg: No edema.      Comments: Mild tenderness over bilateral MCP joints with no restriction of movement to both active or passive range of motion, neurovascular intact with a normal thumbs up, okay, and finger cross test.  Normal sensation along the ulnar and median distributions bilaterally.  Patient has no tenderness to the anatomic snuffbox or bony tenderness to the wrist or elbow bilaterally.    No evidence of acute injury to right foot mild tenderness to palpation with no wound, erythema, induration, or fluctuance.   Lymphadenopathy:  Cervical: No cervical adenopathy.   Skin:     General: Skin is warm.      Capillary Refill: Capillary refill takes less than 2 seconds.      Coloration: Skin is not jaundiced or pale.      Findings: No bruising, erythema, lesion or rash.   Neurological:      General: No focal deficit present.      Mental Status: He is alert and  oriented to person, place, and time.      Sensory: No sensory deficit.      Motor: No weakness.   Psychiatric:         Mood and Affect: Mood normal.          MDM  Number of Diagnoses or Management Options  Contusion of dorsum of hand: new and requires workup  Diagnosis management comments: Patient has primary complaint of bilateral hand pain after a fall, exam is overall reassuring but patient does have some deformity to his bilateral MCP joints uncertain if this represents a chronic versus an acute process.  Fortunately x-rays are reassuring with no evidence of acute fracture or dislocation and this is consistent with patient's reassuring physical exam, will proceed with discharge with recommendation for RICE therapy as well as referral to orthopedic surgeon at patient's request given that he is new to the area and wishes to establish continued care for his bilateral hip replacements.  No evidence of retained foreign body noted on x-rays of his foot and no evidence of external wound, will recommend conservative therapy and close follow-up.       Amount and/or Complexity of Data Reviewed  Tests in the radiology section of CPT??: reviewed           Procedures

## 2021-01-08 ENCOUNTER — Inpatient Hospital Stay
Admit: 2021-01-08 | Discharge: 2021-01-08 | Disposition: A | Discharge status: Home / Self Care | Payer: MEDICARE | Attending: Student in an Organized Health Care Education/Training Program

## 2021-01-08 DIAGNOSIS — S29012A Strain of muscle and tendon of back wall of thorax, initial encounter: Secondary | ICD-10-CM

## 2021-01-08 MED ORDER — CYCLOBENZAPRINE 10 MG TAB
10 mg | ORAL_TABLET | Freq: Three times a day (TID) | ORAL | 0 refills | Status: AC | PRN
Start: 2021-01-08 — End: ?

## 2021-01-08 MED ORDER — IBUPROFEN 600 MG TAB
600 mg | ORAL_TABLET | Freq: Four times a day (QID) | ORAL | 0 refills | Status: AC | PRN
Start: 2021-01-08 — End: ?

## 2021-01-08 NOTE — ED Notes (Signed)
Patient c/o pain to right scapula, right neck and upper back x 1.5 weeks.  He denies any injury.  C/o pain with rom to neck.

## 2021-01-08 NOTE — ED Provider Notes (Signed)
ED Provider Notes by Wayne Both, PA-C at 01/08/21 1158                Author: Wayne Both, PA-C  Service: Emergency Medicine  Author Type: Physician Assistant       Filed: 01/08/21 1208  Date of Service: 01/08/21 1158  Status: Attested           Editor: Reinaldo Raddle (Physician Assistant)  Cosigner: Elias Else, MD at 01/09/21 [phone]          Attestation signed by Elias Else, MD at 01/09/21 [phone]          This patient was seen by an advanced practice practitioner (APP) such as a physician assistant or nurse practitioner trained to practice in  emergency medicine, and part of our patient care team here in the emergency department. I, Beecher Mcardle, MD, was available in the emergency department for collaboration and consultation, if needed pursuant to the Code of [place]. The patient was seen,  evaluated, examined, treated including performance of procedures, and disposition by the APP independently, including the issuance of any prescriptions and follow-up instructions with the patient's primary care physician or specialist. I have reviewed  the chart after the patient's visit to assess for any concerning findings or deviations from standard of care that may result in detriment to the patient's care and safety.                                                                         EMERGENCY DEPARTMENT HISTORY & PHYSICAL EXAM      [facility]   01/08/2021, 11:59 AM      Clinical Impression:      1.  Trapezius muscle strain, right, initial encounter         2.  Muscle spasm            Assessment/Differential Diagnosis:      Ddx muscular pain, spasm, bony injury, trauma, infection all considered.      ED Course:    Initial assessment performed. The patients presenting problems have been discussed, and they are in agreement with the care plan formulated and outlined with them.  I have encouraged them to ask questions  as they arise throughout their visit.      Pt with 2 week  history right sided neck pain/spasm. No trauma, unusual activity. No recent illness   Exam with + pain/spasm right trapezius m, reproducible pain. No other concerning findings.   Will treat with motrin/flexeril.   Pt with HIV, taking antivirals as prescribed, recent blood draw 2 months ago with undetected viral load per pt   Will have pt follow up with PCP, return precautions given             Medical Chart Review:   I have reviewed triage nursing documentation.   Review of old medical records with the following pertinent information:          Disposition:  Home  in good condition.           Chief Complaint       Patient presents with        ?  Neck Pain        ?  Back Pain        HPI:     The history is provided by patient. No language interpreter used.      Sean Graham is a 45 y.o. male presenting to the Emergency Department with complaints of right-sided neck pain.  Patient states about 2 weeks ago while lying on the couch he had what felt like a "crick" in his neck.  He thought he just laid wrong with  some stiffness and pain to the right side of his neck.  That pain has continued with some discomfort felt on the left side as well.  Patient initially tried some Naprosyn with slight relief but no medications recently.  He denies any trauma or unusual  activity.  There is been no fever, sore throat or recent illness.  He is able to do his normal activities.  He denies any extremity weakness or numbness.  No previous similar symptoms.  Patient is HIV positive, does take his antivirals as prescribed  and states his last blood draw with a viral load that was undetectable approximately 2 months ago.           I have reviewed all PMHX, FMHX and Social Hx as entered into the medical record in the chart below using the Epic Template.      Review of Systems:   Constitutional: neg for fever, chills   ENT:  neg for URI symptoms. Neg for vision changes.   Respiratory:  neg for cough, shortness of breath   Neck: pos for neck  pain/+ for stiffness. No difficulty swallowing. No swelling.   Cardiovascular:  neg for chest pain   GI:  neg for abdominal pain. Neg for nausea, no vomiting   GU:  No urinary symptoms. No Flank pain.   MSK: neg for back pain. Neg for extremity pain or weakness. No injury.    Integumentary: no rashes, or skin trauma   Neurological: neg for headaches   All other systems reviewed negative with exception of positives in ROS and HPI.      Past Medical History:     Past Medical History:        Diagnosis  Date         ?  HIV (human immunodeficiency virus infection) (HCC)             Past Surgical History:     Past Surgical History:         Procedure  Laterality  Date          ?  HX HIP REPLACEMENT  Bilateral             Family History:   History reviewed. No pertinent family history.      Social History:     Social History          Tobacco Use         ?  Smoking status:  Every Day     ?  Smokeless tobacco:  Never        ?  Tobacco comments:             black & mild       Substance Use Topics         ?  Alcohol use:  Not Currently     ?  Drug use:  Yes              Types:  Marijuana           Allergies:  Allergies        Allergen  Reactions         ?  Clindamycin  Hives           Vital Signs:     Vitals:          01/08/21 1145        BP:  (!) 125/91     Pulse:  72     Resp:  18     Temp:  98.3 ??F (36.8 ??C)     SpO2:  100%     Weight:  64.9 kg (143 lb)        Height:  5\' 7"  (1.702 m)        Physical Exam:   Vital Signs Reviewed. Nursing Notes Reviewed.   Constitutional:  Well developed, well nourished patient. Appearance and behavior are age and situation appropriate. Ambulating normally   Head: Normocephalic, Atraumatic, nontender    Eyes: Visual acuity grossly normal by my exam. Conjunctiva clear,Sclera anicteric. EOMs intact. Eyelids normal    grossly intact, TMs normal. Throat normal. No mouth trauma    Neck:  supple, range of motion intact, increased pain with right rotation and forward flexion, no  midline tenderness, no bony defect appreciated. No swelling or signs of trauma. Pain with palpation to right trapezius with spasm noted. Reproducible pain  with palpation to area   Lungs: No respiratory distress. Lungs CTAB    CV:  RR&R without murmur    Thorax: no chest wall tenderness to palpation. No signs of trauma    Neuro:  A&O x 3. CN II-XII grossly intact.No gross neuro deficits.  No facial asymmetry with testing. Cerebellar tests intact. Strength and sensation intact, bilat equal with testing of UE and LE    Skin:  Warm, dry, no rash.    Spine:  No tenderness to palpation over thoracic or lumbar spine. No signs of trauma. No bony defect on my exam. No swelling.       Diagnostics:      Labs -    No results found for this or any previous visit (from the past 12 hour(s)).      Radiologic Studies -      No orders to display          CT Results  (Last 48 hours)             None                    CXR Results  (Last 48 hours)             None                     Medications given in the ED-   Medications - No data to display      Please note that this dictation was completed with Dragon, the computer voice recognition software.  Quite often unanticipated grammatical, syntax, homophones, and other interpretive errors are  inadvertently transcribed by the computer software.  Please disregard these errors.  Please excuse any errors that have escaped final proofreading.

## 2021-01-08 NOTE — ED Notes (Signed)
I have reviewed discharge instructions with the patient.  The patient verbalized understanding.   Current Discharge Medication List        START taking these medications    Details   cyclobenzaprine (FLEXERIL) 10 mg tablet Take 1 Tablet by mouth three (3) times daily as needed for Muscle Spasm(s).  Qty: 15 Tablet, Refills: 0  Start date: 01/08/2021      ibuprofen (MOTRIN) 600 mg tablet Take 1 Tablet by mouth every six (6) hours as needed for Pain.  Qty: 20 Tablet, Refills: 0  Start date: 01/08/2021
